# Patient Record
Sex: Female | Born: 1955 | Race: White | Hispanic: No | Marital: Married | Smoking: Never smoker
Health system: Southern US, Community
[De-identification: ages and names within clinical notes are randomized; demographics above are authoritative.]

## PROBLEM LIST (undated history)

## (undated) DIAGNOSIS — M5030 Other cervical disc degeneration, unspecified cervical region: Secondary | ICD-10-CM

## (undated) DIAGNOSIS — K219 Gastro-esophageal reflux disease without esophagitis: Secondary | ICD-10-CM

## (undated) DIAGNOSIS — B019 Varicella without complication: Secondary | ICD-10-CM

## (undated) DIAGNOSIS — I10 Essential (primary) hypertension: Secondary | ICD-10-CM

## (undated) DIAGNOSIS — G709 Myoneural disorder, unspecified: Secondary | ICD-10-CM

## (undated) DIAGNOSIS — D649 Anemia, unspecified: Secondary | ICD-10-CM

## (undated) DIAGNOSIS — E7800 Pure hypercholesterolemia, unspecified: Secondary | ICD-10-CM

## (undated) HISTORY — PX: BREAST SURGERY: SHX581

## (undated) HISTORY — PX: NECK SURGERY: SHX720

## (undated) HISTORY — PX: OTHER SURGICAL HISTORY: SHX169

## (undated) HISTORY — PX: ABDOMINAL HYSTERECTOMY: SHX81

## (undated) HISTORY — PX: CHOLECYSTECTOMY: SHX55

---

## 1993-01-15 HISTORY — PX: BREAST BIOPSY: SHX20

## 1993-01-15 HISTORY — PX: BREAST LUMPECTOMY: SHX2

## 1993-01-15 HISTORY — PX: BREAST EXCISIONAL BIOPSY: SUR124

## 2017-02-12 ENCOUNTER — Other Ambulatory Visit: Payer: Self-pay | Admitting: Internal Medicine

## 2017-02-12 DIAGNOSIS — Z1231 Encounter for screening mammogram for malignant neoplasm of breast: Secondary | ICD-10-CM

## 2017-03-08 ENCOUNTER — Ambulatory Visit
Admission: RE | Admit: 2017-03-08 | Discharge: 2017-03-08 | Disposition: A | Discharge status: Home / Self Care | Payer: 59 | Source: Ambulatory Visit | Attending: Internal Medicine | Admitting: Internal Medicine

## 2017-03-08 DIAGNOSIS — Z1231 Encounter for screening mammogram for malignant neoplasm of breast: Secondary | ICD-10-CM | POA: Diagnosis present

## 2017-04-09 ENCOUNTER — Inpatient Hospital Stay
Admission: RE | Admit: 2017-04-09 | Discharge: 2017-04-09 | Disposition: A | Discharge status: Home / Self Care | Payer: Self-pay | Source: Ambulatory Visit | Attending: *Deleted | Admitting: *Deleted

## 2017-04-09 ENCOUNTER — Other Ambulatory Visit: Payer: Self-pay | Admitting: *Deleted

## 2017-04-09 DIAGNOSIS — Z9289 Personal history of other medical treatment: Secondary | ICD-10-CM

## 2018-08-12 ENCOUNTER — Other Ambulatory Visit: Payer: Self-pay | Admitting: Internal Medicine

## 2018-09-24 ENCOUNTER — Other Ambulatory Visit: Payer: Self-pay | Admitting: Obstetrics and Gynecology

## 2018-09-24 DIAGNOSIS — N644 Mastodynia: Secondary | ICD-10-CM

## 2018-09-24 DIAGNOSIS — Z1231 Encounter for screening mammogram for malignant neoplasm of breast: Secondary | ICD-10-CM

## 2018-10-14 ENCOUNTER — Ambulatory Visit
Admission: RE | Admit: 2018-10-14 | Discharge: 2018-10-14 | Disposition: A | Discharge status: Home / Self Care | Payer: Medicare Other | Source: Ambulatory Visit | Attending: Obstetrics and Gynecology | Admitting: Obstetrics and Gynecology

## 2018-10-14 DIAGNOSIS — N644 Mastodynia: Secondary | ICD-10-CM

## 2018-10-14 DIAGNOSIS — Z1231 Encounter for screening mammogram for malignant neoplasm of breast: Secondary | ICD-10-CM | POA: Insufficient documentation

## 2019-01-21 ENCOUNTER — Other Ambulatory Visit: Payer: Medicare Other

## 2019-02-04 ENCOUNTER — Ambulatory Visit: Payer: Medicare Other | Attending: Internal Medicine

## 2019-02-04 DIAGNOSIS — Z23 Encounter for immunization: Secondary | ICD-10-CM

## 2019-02-04 NOTE — Progress Notes (Signed)
   Covid-19 Vaccination Clinic  Name:  Lynn Finley    MRN: [id number] DOB: Oct 14, 1955  02/04/2019  [name] was observed post Covid-19 immunization for 15 minutes without incidence. She was provided with Vaccine Information Sheet and instruction to access the V-Safe system.   [name] was instructed to call 911 with any severe reactions post vaccine: Marland Kitchen Difficulty breathing  . Swelling of your face and throat  . A fast heartbeat  . A bad rash all over your body  . Dizziness and weakness    Immunizations Administered    Name Date Dose VIS Date Route   Pfizer COVID-19 Vaccine 02/04/2019  9:31 AM 0.3 mL 12/26/2018 Intramuscular   Manufacturer: [place]   Lot: [id number]   [place]: [id number]

## 2019-02-23 ENCOUNTER — Ambulatory Visit: Payer: Medicare Other | Attending: Internal Medicine

## 2019-02-23 DIAGNOSIS — Z23 Encounter for immunization: Secondary | ICD-10-CM | POA: Insufficient documentation

## 2019-02-23 NOTE — Progress Notes (Signed)
   Covid-19 Vaccination Clinic  Name:  Lynn Finley    MRN: [id number] DOB: [birth year]  02/23/2019  Lynn Finley was observed post Covid-19 immunization for 15 minutes without incidence. She was provided with Vaccine Information Sheet and instruction to access the V-Safe system.   Lynn Finley was instructed to call 911 with any severe reactions post vaccine: Marland Kitchen Difficulty breathing  . Swelling of your face and throat  . A fast heartbeat  . A bad rash all over your body  . Dizziness and weakness    Immunizations Administered    Name Date Dose VIS Date Route   Pfizer COVID-19 Vaccine 02/23/2019 12:07 PM 0.3 mL 12/26/2018 Intramuscular   Manufacturer: [place]   Lot: [id number]   [place]: [id number]

## 2019-06-19 ENCOUNTER — Other Ambulatory Visit: Payer: Self-pay | Admitting: Internal Medicine

## 2019-06-19 DIAGNOSIS — Z1231 Encounter for screening mammogram for malignant neoplasm of breast: Secondary | ICD-10-CM

## 2019-10-16 ENCOUNTER — Other Ambulatory Visit: Payer: Self-pay | Admitting: Internal Medicine

## 2019-10-16 DIAGNOSIS — M25559 Pain in unspecified hip: Secondary | ICD-10-CM

## 2019-10-19 ENCOUNTER — Other Ambulatory Visit: Payer: Self-pay | Admitting: Internal Medicine

## 2019-10-19 DIAGNOSIS — M25559 Pain in unspecified hip: Secondary | ICD-10-CM

## 2019-10-28 ENCOUNTER — Other Ambulatory Visit: Payer: Self-pay | Admitting: Internal Medicine

## 2019-10-28 DIAGNOSIS — M25559 Pain in unspecified hip: Secondary | ICD-10-CM

## 2019-11-09 ENCOUNTER — Other Ambulatory Visit: Payer: Self-pay | Admitting: Internal Medicine

## 2019-11-09 DIAGNOSIS — M25559 Pain in unspecified hip: Secondary | ICD-10-CM

## 2019-11-13 ENCOUNTER — Other Ambulatory Visit: Payer: Self-pay

## 2019-11-13 ENCOUNTER — Encounter (INDEPENDENT_AMBULATORY_CARE_PROVIDER_SITE_OTHER): Payer: Self-pay

## 2019-11-13 ENCOUNTER — Ambulatory Visit
Admission: RE | Admit: 2019-11-13 | Discharge: 2019-11-13 | Disposition: A | Discharge status: Home / Self Care | Payer: Medicare Other | Source: Ambulatory Visit | Attending: Internal Medicine | Admitting: Internal Medicine

## 2019-11-13 DIAGNOSIS — M25559 Pain in unspecified hip: Secondary | ICD-10-CM | POA: Diagnosis present

## 2019-12-18 ENCOUNTER — Other Ambulatory Visit: Payer: Self-pay | Admitting: Internal Medicine

## 2019-12-18 DIAGNOSIS — M4807 Spinal stenosis, lumbosacral region: Secondary | ICD-10-CM

## 2019-12-29 ENCOUNTER — Ambulatory Visit
Admission: RE | Admit: 2019-12-29 | Discharge: 2019-12-29 | Disposition: A | Discharge status: Home / Self Care | Payer: Medicare Other | Source: Ambulatory Visit | Attending: Internal Medicine | Admitting: Internal Medicine

## 2019-12-29 ENCOUNTER — Other Ambulatory Visit: Payer: Self-pay

## 2019-12-29 DIAGNOSIS — M4807 Spinal stenosis, lumbosacral region: Secondary | ICD-10-CM | POA: Diagnosis not present

## 2020-04-21 ENCOUNTER — Other Ambulatory Visit
Admission: RE | Admit: 2020-04-21 | Discharge: 2020-04-21 | Disposition: A | Discharge status: Home / Self Care | Payer: Medicare Other | Source: Ambulatory Visit | Attending: Gastroenterology | Admitting: Gastroenterology

## 2020-04-21 ENCOUNTER — Other Ambulatory Visit: Payer: Self-pay

## 2020-04-21 DIAGNOSIS — Z01812 Encounter for preprocedural laboratory examination: Secondary | ICD-10-CM | POA: Insufficient documentation

## 2020-04-21 DIAGNOSIS — Z20822 Contact with and (suspected) exposure to covid-19: Secondary | ICD-10-CM | POA: Insufficient documentation

## 2020-04-22 ENCOUNTER — Encounter: Payer: Self-pay | Admitting: *Deleted

## 2020-04-22 LAB — SARS CORONAVIRUS 2 (TAT 6-24 HRS): SARS Coronavirus 2: NEGATIVE

## 2020-04-25 ENCOUNTER — Encounter: Payer: Self-pay | Admitting: *Deleted

## 2020-04-25 ENCOUNTER — Ambulatory Visit: Payer: Medicare Other | Admitting: Anesthesiology

## 2020-04-25 ENCOUNTER — Other Ambulatory Visit: Payer: Self-pay

## 2020-04-25 ENCOUNTER — Encounter
Admission: RE | Disposition: A | Discharge status: Home / Self Care | Payer: Self-pay | Source: Home / Self Care | Attending: Gastroenterology

## 2020-04-25 ENCOUNTER — Ambulatory Visit
Admission: RE | Admit: 2020-04-25 | Discharge: 2020-04-25 | Disposition: A | Discharge status: Home / Self Care | Payer: Medicare Other | Attending: Gastroenterology | Admitting: Gastroenterology

## 2020-04-25 DIAGNOSIS — Z791 Long term (current) use of non-steroidal anti-inflammatories (NSAID): Secondary | ICD-10-CM | POA: Diagnosis not present

## 2020-04-25 DIAGNOSIS — Z86010 Personal history of colonic polyps: Secondary | ICD-10-CM | POA: Insufficient documentation

## 2020-04-25 DIAGNOSIS — K640 First degree hemorrhoids: Secondary | ICD-10-CM | POA: Insufficient documentation

## 2020-04-25 DIAGNOSIS — K5730 Diverticulosis of large intestine without perforation or abscess without bleeding: Secondary | ICD-10-CM | POA: Diagnosis not present

## 2020-04-25 DIAGNOSIS — Z79899 Other long term (current) drug therapy: Secondary | ICD-10-CM | POA: Insufficient documentation

## 2020-04-25 DIAGNOSIS — Z1211 Encounter for screening for malignant neoplasm of colon: Secondary | ICD-10-CM | POA: Insufficient documentation

## 2020-04-25 HISTORY — DX: Varicella without complication: B01.9

## 2020-04-25 HISTORY — PX: COLONOSCOPY: SHX5424

## 2020-04-25 HISTORY — DX: Essential (primary) hypertension: I10

## 2020-04-25 HISTORY — DX: Gastro-esophageal reflux disease without esophagitis: K21.9

## 2020-04-25 HISTORY — DX: Pure hypercholesterolemia, unspecified: E78.00

## 2020-04-25 HISTORY — DX: Other cervical disc degeneration, unspecified cervical region: M50.30

## 2020-04-25 SURGERY — COLONOSCOPY
Anesthesia: General

## 2020-04-25 MED ORDER — PROPOFOL 500 MG/50ML IV EMUL
INTRAVENOUS | Status: AC
  Filled 2020-04-25: qty 50

## 2020-04-25 MED ORDER — SODIUM CHLORIDE 0.9 % IV SOLN: INTRAVENOUS | Status: DC

## 2020-04-25 MED ORDER — PROPOFOL 10 MG/ML IV BOLUS
INTRAVENOUS | Status: DC | PRN
  Administered 2020-04-25: 70 mg via INTRAVENOUS
  Administered 2020-04-25: 30 mg via INTRAVENOUS

## 2020-04-25 MED ORDER — PROPOFOL 500 MG/50ML IV EMUL
INTRAVENOUS | Status: DC | PRN
  Administered 2020-04-25: 125 ug/kg/min via INTRAVENOUS

## 2020-04-25 NOTE — Op Note (Signed)
[facility] Gastroenterology Patient Name: Lynn Finley Procedure Date: 04/25/2020 7:45 AM MRN: [id number] Account #: [id number] Date of Birth: 1955-07-07 Admit Type: Outpatient Age: 65 Room: [facility] ENDO ROOM 3 Gender: Female Note Status: Finalized Procedure:             Colonoscopy Indications:           Surveillance: Personal history of adenomatous polyps                         on last colonoscopy > 5 years ago Providers:             Andrey Farmer MD, MD Referring MD:          Leonie Douglas. Doy Hutching, MD (Referring MD) Medicines:             Monitored Anesthesia Care Complications:         No immediate complications. Procedure:             Pre-Anesthesia Assessment:                        - Prior to the procedure, a History and Physical was                         performed, and patient medications and allergies were                         reviewed. The patient is competent. The risks and                         benefits of the procedure and the sedation options and                         risks were discussed with the patient. All questions                         were answered and informed consent was obtained.                         Patient identification and proposed procedure were                         verified by the physician, the nurse, the anesthetist                         and the technician in the endoscopy suite. Mental                         Status Examination: alert and oriented. Airway                         Examination: normal oropharyngeal airway and neck                         mobility. Respiratory Examination: clear to                         auscultation. CV Examination: normal. Prophylactic  Antibiotics: The patient does not require prophylactic                         antibiotics. Prior Anticoagulants: The patient has                         taken no previous anticoagulant or antiplatelet                          agents. ASA Grade Assessment: II - A patient with mild                         systemic disease. After reviewing the risks and                         benefits, the patient was deemed in satisfactory                         condition to undergo the procedure. The anesthesia                         plan was to use monitored anesthesia care (MAC).                         Immediately prior to administration of medications,                         the patient was re-assessed for adequacy to receive                         sedatives. The heart rate, respiratory rate, oxygen                         saturations, blood pressure, adequacy of pulmonary                         ventilation, and response to care were monitored                         throughout the procedure. The physical status of the                         patient was re-assessed after the procedure.                        After obtaining informed consent, the colonoscope was                         passed under direct vision. Throughout the procedure,                         the patient's blood pressure, pulse, and oxygen                         saturations were monitored continuously. The                         Colonoscope was introduced through the anus and  advanced to the the cecum, identified by appendiceal                         orifice and ileocecal valve. The colonoscopy was                         performed without difficulty. The patient tolerated                         the procedure well. The quality of the bowel                         preparation was good. Findings:      The perianal and digital rectal examinations were normal.      Scattered small-mouthed diverticula were found in the sigmoid colon and       descending colon.      Internal hemorrhoids were found during retroflexion. The hemorrhoids       were Grade I (internal hemorrhoids that do not prolapse).      The exam was otherwise  without abnormality on direct and retroflexion       views. Impression:            - Diverticulosis in the sigmoid colon and in the                         descending colon.                        - Internal hemorrhoids.                        - The examination was otherwise normal on direct and                         retroflexion views.                        - No specimens collected. Recommendation:        - Discharge patient to home.                        - Resume previous diet.                        - Continue present medications.                        - Repeat colonoscopy in 10 years for surveillance.                        - Return to referring physician as previously                         scheduled. Procedure Code(s):     --- Professional ---                        L4562, Colorectal cancer screening; colonoscopy on                         individual at high risk Diagnosis Code(s):     ---  Professional ---                        Z86.010, Personal history of colonic polyps                        K64.0, First degree hemorrhoids                        K57.30, Diverticulosis of large intestine without                         perforation or abscess without bleeding CPT copyright 2019 American Medical Association. All rights reserved. The codes documented in this report are preliminary and upon coder review may  be revised to meet current compliance requirements. Andrey Farmer MD, MD 04/25/2020 8:07:19 AM Number of Addenda: 0 Note Initiated On: 04/25/2020 7:45 AM Scope Withdrawal Time: 0 hours 8 minutes 58 seconds  Total Procedure Duration: 0 hours 13 minutes 2 seconds  Estimated Blood Loss:  Estimated blood loss: none.      [facility]

## 2020-04-25 NOTE — Interval H&P Note (Signed)
History and Physical Interval Note:  04/25/2020 7:44 AM  Lynn Finley  has presented today for surgery, with the diagnosis of PERSONAL HX.OF COLON POLYPS.  The various methods of treatment have been discussed with the patient and family. After consideration of risks, benefits and other options for treatment, the patient has consented to  Procedure(s): COLONOSCOPY (N/A) as a surgical intervention.  The patient's history has been reviewed, patient examined, no change in status, stable for surgery.  I have reviewed the patient's chart and labs.  Questions were answered to the patient's satisfaction.     Lesly Rubenstein  Ok to proceed with colonoscopy

## 2020-04-25 NOTE — Anesthesia Preprocedure Evaluation (Signed)
Anesthesia Evaluation  Patient identified by MRN, date of birth, ID band Patient awake    Reviewed: Allergy & Precautions, NPO status , Patient's Chart, lab work & pertinent test results  History of Anesthesia Complications Negative for: history of anesthetic complications  Airway Mallampati: II  TM Distance: >3 FB Neck ROM: Full    Dental no notable dental hx. (+) Teeth Intact   Pulmonary neg pulmonary ROS, neg sleep apnea, neg COPD, Patient abstained from smoking.Not current smoker,    Pulmonary exam normal breath sounds clear to auscultation       Cardiovascular Exercise Tolerance: Good METShypertension, Pt. on medications (-) CAD and (-) Past MI (-) dysrhythmias  Rhythm:Regular Rate:Normal - Systolic murmurs    Neuro/Psych negative neurological ROS  negative psych ROS   GI/Hepatic GERD  ,(+)     (-) substance abuse  ,   Endo/Other  neg diabetes  Renal/GU negative Renal ROS     Musculoskeletal   Abdominal   Peds  Hematology   Anesthesia Other Findings Past Medical History: No date: Chicken pox No date: DDD (degenerative disc disease), cervical No date: GERD (gastroesophageal reflux disease) No date: Hypercholesterolemia No date: Hypertension  Reproductive/Obstetrics                             Anesthesia Physical Anesthesia Plan  ASA: II  Anesthesia Plan: General   Post-op Pain Management:    Induction: Intravenous  PONV Risk Score and Plan: 3 and Ondansetron, Propofol infusion and TIVA  Airway Management Planned: Nasal Cannula  Additional Equipment: None  Intra-op Plan:   Post-operative Plan:   Informed Consent: I have reviewed the patients History and Physical, chart, labs and discussed the procedure including the risks, benefits and alternatives for the proposed anesthesia with the patient or authorized representative who has indicated his/her understanding and  acceptance.     Dental advisory given  Plan Discussed with: CRNA and Surgeon  Anesthesia Plan Comments: (Discussed risks of anesthesia with patient, including possibility of difficulty with spontaneous ventilation under anesthesia necessitating airway intervention, PONV, and rare risks such as cardiac or respiratory or neurological events. Patient understands.)        Anesthesia Quick Evaluation

## 2020-04-25 NOTE — H&P (Signed)
Outpatient short stay form Pre-procedure 04/25/2020 7:42 AM Lynn Miyamoto MD, MPH  Primary Physician: Dr. Doy Hutching  Reason for visit:  Surveillance colonoscopy  History of present illness:   65 y/o lady with history of tubular adenoma on colonoscopy in 2013 here for surveillance colonoscopy. No family history of GI malignancies. No blood thinners. History of cholecystectomy and hysterectomy.    Current Facility-Administered Medications:  .  0.9 %  sodium chloride infusion, , Intravenous, Continuous, Kylii Ennis, Hilton Cork, MD, Last Rate: 20 mL/hr at 04/25/20 1610, Continued from Pre-op at 04/25/20 0727  Medications Prior to Admission  Medication Sig Dispense Refill Last Dose  . bisoprolol-hydrochlorothiazide (ZIAC) 5-6.25 MG tablet Take 1 tablet by mouth daily.   04/24/2020 at 2100  . celecoxib (CELEBREX) 100 MG capsule Take 100 mg by mouth 2 (two) times daily.   04/24/2020 at 0800  . Cholecalciferol 10 MCG (400 UNIT) CAPS Take 1,000 Units by mouth daily.   Past Week at Unknown time  . cyanocobalamin 1000 MCG tablet Take 1,000 mcg by mouth daily.   Past Week at Unknown time  . DULoxetine (CYMBALTA) 30 MG capsule Take 30 mg by mouth daily.   Past Week at Unknown time  . gabapentin (NEURONTIN) 300 MG capsule Take 300 mg by mouth daily.   Past Week at Unknown time  . pravastatin (PRAVACHOL) 10 MG tablet Take 10 mg by mouth daily.   Past Week at Unknown time  . pregabalin (LYRICA) 75 MG capsule Take 75 mg by mouth 2 (two) times daily.   Past Week at Unknown time  . psyllium (METAMUCIL) 58.6 % packet Take 1 packet by mouth daily.   Past Week at Unknown time     No Known Allergies   Past Medical History:  Diagnosis Date  . Chicken pox   . DDD (degenerative disc disease), cervical   . GERD (gastroesophageal reflux disease)   . Hypercholesterolemia   . Hypertension     Review of systems:  Otherwise negative.    Physical Exam  Gen: Alert, oriented. Appears stated age.  HEENT:  PERRLA. Lungs: No respiratory distress CV: RRR Abd: soft, benign, no masses Ext: No edema    Planned procedures: Proceed with colonoscopy. The patient understands the nature of the planned procedure, indications, risks, alternatives and potential complications including but not limited to bleeding, infection, perforation, damage to internal organs and possible oversedation/side effects from anesthesia. The patient agrees and gives consent to proceed.  Please refer to procedure notes for findings, recommendations and patient disposition/instructions.     Lynn Miyamoto MD, MPH Gastroenterology 04/25/2020  7:42 AM

## 2020-04-25 NOTE — Anesthesia Postprocedure Evaluation (Signed)
Anesthesia Post Note  Patient: Lynn Finley  Procedure(s) Performed: COLONOSCOPY (N/A )  Patient location during evaluation: Endoscopy Anesthesia Type: General Level of consciousness: awake and alert Pain management: pain level controlled Vital Signs Assessment: post-procedure vital signs reviewed and stable Respiratory status: spontaneous breathing, nonlabored ventilation, respiratory function stable and patient connected to nasal cannula oxygen Cardiovascular status: blood pressure returned to baseline and stable Postop Assessment: no apparent nausea or vomiting Anesthetic complications: no   No complications documented.   Last Vitals:  Vitals:   04/25/20 0817 04/25/20 0827  BP: 99/75 99/84  Pulse: 70 61  Resp: 16 15  Temp:    SpO2: 98% 98%    Last Pain:  Vitals:   04/25/20 0827  TempSrc:   PainSc: 0-No pain                 Arita Miss

## 2020-04-25 NOTE — Transfer of Care (Signed)
Immediate Anesthesia Transfer of Care Note  Patient: Lynn Finley  Procedure(s) Performed: COLONOSCOPY (N/A )  Patient Location: PACU  Anesthesia Type:MAC  Level of Consciousness: awake, alert  and oriented  Airway & Oxygen Therapy: Patient Spontanous Breathing  Post-op Assessment: Report given to RN and Post -op Vital signs reviewed and stable  Post vital signs: stable  Last Vitals:  Vitals Value Taken Time  BP    Temp    Pulse 63 04/25/20 0808  Resp 21 04/25/20 0808  SpO2 98 % 04/25/20 0808  Vitals shown include unvalidated device data.  Last Pain:  Vitals:   04/25/20 0701  TempSrc: Temporal  PainSc: 0-No pain         Complications: No complications documented.

## 2020-04-26 ENCOUNTER — Encounter: Payer: Self-pay | Admitting: Gastroenterology

## 2020-08-24 ENCOUNTER — Other Ambulatory Visit: Payer: Self-pay | Admitting: Internal Medicine

## 2020-08-24 DIAGNOSIS — G8929 Other chronic pain: Secondary | ICD-10-CM

## 2020-09-02 ENCOUNTER — Ambulatory Visit
Admission: RE | Admit: 2020-09-02 | Discharge: 2020-09-02 | Disposition: A | Discharge status: Home / Self Care | Payer: Medicare Other | Source: Ambulatory Visit | Attending: Internal Medicine | Admitting: Internal Medicine

## 2020-09-02 ENCOUNTER — Other Ambulatory Visit: Payer: Self-pay

## 2020-09-02 DIAGNOSIS — M25562 Pain in left knee: Secondary | ICD-10-CM | POA: Insufficient documentation

## 2020-09-02 DIAGNOSIS — G8929 Other chronic pain: Secondary | ICD-10-CM | POA: Insufficient documentation

## 2020-12-14 ENCOUNTER — Other Ambulatory Visit: Payer: Self-pay | Admitting: Internal Medicine

## 2020-12-14 DIAGNOSIS — Z1231 Encounter for screening mammogram for malignant neoplasm of breast: Secondary | ICD-10-CM

## 2021-01-02 ENCOUNTER — Ambulatory Visit: Payer: Medicare Other | Admitting: Dermatology

## 2021-01-04 ENCOUNTER — Ambulatory Visit: Payer: Medicare Other | Admitting: Dermatology

## 2021-01-26 ENCOUNTER — Ambulatory Visit
Admission: RE | Admit: 2021-01-26 | Discharge: 2021-01-26 | Disposition: A | Discharge status: Home / Self Care | Payer: Medicare Other | Source: Ambulatory Visit | Attending: Internal Medicine | Admitting: Internal Medicine

## 2021-01-26 ENCOUNTER — Other Ambulatory Visit: Payer: Self-pay

## 2021-01-26 DIAGNOSIS — Z1231 Encounter for screening mammogram for malignant neoplasm of breast: Secondary | ICD-10-CM | POA: Insufficient documentation

## 2021-01-31 ENCOUNTER — Other Ambulatory Visit: Payer: Self-pay

## 2021-01-31 ENCOUNTER — Ambulatory Visit: Payer: Medicare Other | Admitting: Dermatology

## 2021-01-31 DIAGNOSIS — Z1283 Encounter for screening for malignant neoplasm of skin: Secondary | ICD-10-CM

## 2021-01-31 DIAGNOSIS — L578 Other skin changes due to chronic exposure to nonionizing radiation: Secondary | ICD-10-CM

## 2021-01-31 DIAGNOSIS — Z808 Family history of malignant neoplasm of other organs or systems: Secondary | ICD-10-CM

## 2021-01-31 DIAGNOSIS — L814 Other melanin hyperpigmentation: Secondary | ICD-10-CM

## 2021-01-31 DIAGNOSIS — D1800 Hemangioma unspecified site: Secondary | ICD-10-CM

## 2021-01-31 DIAGNOSIS — L821 Other seborrheic keratosis: Secondary | ICD-10-CM

## 2021-01-31 DIAGNOSIS — D229 Melanocytic nevi, unspecified: Secondary | ICD-10-CM

## 2021-01-31 DIAGNOSIS — L853 Xerosis cutis: Secondary | ICD-10-CM

## 2021-01-31 NOTE — Progress Notes (Signed)
° °  New Patient Visit  Subjective  Lynn Finley is a 66 y.o. female who presents for the following: TBSE (Pt here for total body exam today. No hx of skin cancer or dysplastic nevi. Family hx of skin cancer - brother. Pt has a spot on her pubic area that she would like checked today. ).  Patient here for full body skin exam and skin cancer screening.  Objective  Well appearing patient in no apparent distress; mood and affect are within normal limits.  A full examination was performed including scalp, head, eyes, ears, nose, lips, neck, chest, axillae, abdomen, back, buttocks, bilateral upper extremities, bilateral lower extremities, hands, feet, fingers, toes, fingernails, and toenails. All findings within normal limits unless otherwise noted below.  right superpubic Stuck-on, waxy, tan-brown papule or plaque --Discussed benign etiology and prognosis.    Assessment & Plan  Xerosis of skin right zygoma  Xerosis vs AK at right zygoma  Recommend a daily moisturizer and sunscreen. Reapply every 2 hrs, Pt to call if doesn't clear up or gets thicker  Actinic keratoses are precancerous spots that appear secondary to cumulative UV radiation exposure/sun exposure over time. They are chronic with expected duration over 1 year. A portion of actinic keratoses will progress to squamous cell carcinoma of the skin. It is not possible to reliably predict which spots will progress to skin cancer and so treatment is recommended to prevent development of skin cancer.  Recommend daily broad spectrum sunscreen SPF 30+ to sun-exposed areas, reapply every 2 hours as needed.  Recommend staying in the shade or wearing long sleeves, sun glasses (UVA+UVB protection) and wide brim hats (4-inch brim around the entire circumference of the hat). Call for new or changing lesions.   Seborrheic keratosis right superpubic  Benign-appearing.  Observation.  Call clinic for new or changing lesions.  Recommend  daily use of broad spectrum spf 30+ sunscreen to sun-exposed areas.    Lentigines - Scattered tan macules - Due to sun exposure - Benign-appearing, observe - Recommend daily broad spectrum sunscreen SPF 30+ to sun-exposed areas, reapply every 2 hours as needed. - Call for any changes  Seborrheic Keratoses - Stuck-on, waxy, tan-brown papules and/or plaques  - Benign-appearing - Discussed benign etiology and prognosis. - Observe - Call for any changes  Melanocytic Nevi - Tan-brown and/or pink-flesh-colored symmetric macules and papules - Benign appearing on exam today - Observation - Call clinic for new or changing moles - Recommend daily use of broad spectrum spf 30+ sunscreen to sun-exposed areas.   Hemangiomas - Red papules - Discussed benign nature - Observe - Call for any changes  Actinic Damage - Chronic condition, secondary to cumulative UV/sun exposure - diffuse scaly erythematous macules with underlying dyspigmentation - Recommend daily broad spectrum sunscreen SPF 30+ to sun-exposed areas, reapply every 2 hours as needed.  - Staying in the shade or wearing long sleeves, sun glasses (UVA+UVB protection) and wide brim hats (4-inch brim around the entire circumference of the hat) are also recommended for sun protection.  - Call for new or changing lesions.  Skin cancer screening performed today.  Return in about 1 year (around 01/31/2022) for TBSE.  I, Harriett Sine, CMA, am acting as scribe for Forest Gleason, MD.   Documentation: I have reviewed the above documentation for accuracy and completeness, and I agree with the above.  Forest Gleason, MD

## 2021-01-31 NOTE — Patient Instructions (Addendum)
Recommend daily broad spectrum sunscreen SPF 30+ to sun-exposed areas, reapply every 2 hours as needed. Call for new or changing lesions.  Staying in the shade or wearing long sleeves, sun glasses (UVA+UVB protection) and wide brim hats (4-inch brim around the entire circumference of the hat) are also recommended for sun protection.    Recommend taking Heliocare sun protection supplement daily in sunny weather for additional sun protection. For maximum protection on the sunniest days, you can take up to 2 capsules of regular Heliocare OR take 1 capsule of Heliocare Ultra. For prolonged exposure (such as a full day in the sun), you can repeat your dose of the supplement 4 hours after your first dose. Heliocare can be purchased at [facility] or at [email or web address].      If You Need Anything After Your Visit  If you have any questions or concerns for your doctor, please call our main line at [phone] and press option 4 to reach your doctor's medical assistant. If no one answers, please leave a voicemail as directed and we will return your call as soon as possible. Messages left after 4 pm will be answered the following business day.   You may also send [place] a message via [place]. We typically respond to MyChart messages within 1-2 business days.  For prescription refills, please ask your pharmacy to contact our office. Our fax number is [phone].  If you have an urgent issue when the clinic is closed that cannot wait until the next business day, you can page your doctor at the number below.    Please note that while we do our best to be available for urgent issues outside of office hours, we are not available 24/7.   If you have an urgent issue and are unable to reach [place], you may choose to seek medical care at your doctor's office, retail clinic, urgent care center, or emergency room.  If you have a medical emergency, please immediately call 911 or go to the emergency  department.  Pager Numbers  - Dr. Nehemiah Massed: [phone]  - Dr. Laurence Ferrari: [phone]  - Dr. Nicole Kindred: [phone]  In the event of inclement weather, please call our main line at [phone] for an update on the status of any delays or closures.  Dermatology Medication Tips: Please keep the boxes that topical medications come in in order to help keep track of the instructions about where and how to use these. Pharmacies typically print the medication instructions only on the boxes and not directly on the medication tubes.   If your medication is too expensive, please contact our office at [phone] option 4 or send [place] a message through [place].   We are unable to tell what your co-pay for medications will be in advance as this is different depending on your insurance coverage. However, we may be able to find a substitute medication at lower cost or fill out paperwork to get insurance to cover a needed medication.   If a prior authorization is required to get your medication covered by your insurance company, please allow [place] 1-2 business days to complete this process.  Drug prices often vary depending on where the prescription is filled and some pharmacies may offer cheaper prices.  The website www.goodrx.com contains coupons for medications through different pharmacies. The prices here do not account for what the cost may be with help from insurance (it may be cheaper with your insurance), but the website can give you the price if you did  not use any insurance.  - You can print the associated coupon and take it with your prescription to the pharmacy.  - You may also stop by our office during regular business hours and pick up a GoodRx coupon card.  - If you need your prescription sent electronically to a different pharmacy, notify our office through [facility] or by phone at [phone] option 4.     Si Usted Necesita Algo Despus de Su Visita  Tambin puede enviarnos un  mensaje a travs de Pharmacist, community. Por lo general respondemos a los mensajes de MyChart en el transcurso de 1 a 2 das hbiles.  Para renovar recetas, por favor pida a su farmacia que se ponga en contacto con nuestra oficina. Harland Dingwall de fax es [place] [phone].  Si tiene un asunto urgente cuando la clnica est cerrada y que no puede esperar hasta el siguiente da hbil, puede llamar/localizar a su doctor(a) al nmero que aparece a continuacin.   Por favor, tenga en cuenta que aunque hacemos todo lo posible para estar disponibles para asuntos urgentes fuera del horario de [place], no estamos disponibles las 24 horas del da, los 7 das de la [place].   Si tiene un problema urgente y no puede comunicarse con nosotros, puede optar por buscar atencin mdica  en el consultorio de su doctor(a), en una clnica privada, en un centro de atencin urgente o en una sala de emergencias.  Si tiene Engineering geologist, por favor llame inmediatamente al 911 o vaya a la sala de emergencias.  Nmeros de bper  - Dr. Nehemiah Massed: [phone]  - Dra. Moye: [phone]  - Dra. Nicole Kindred: [phone]  En caso de inclemencias del [place], por favor llame a Johnsie Kindred principal al [phone] para una actualizacin sobre el [place] de cualquier retraso o cierre.  Consejos para la medicacin en dermatologa: Por favor, guarde las cajas en las que vienen los medicamentos de uso tpico para ayudarle a seguir las instrucciones sobre dnde y cmo usarlos. Las farmacias generalmente imprimen las instrucciones del medicamento slo en las cajas y no directamente en los tubos del [place].   Si su medicamento es muy caro, por favor, pngase en contacto con Zigmund Daniel llamando al [phone] y presione la opcin 4 o envenos un mensaje a travs de Pharmacist, community.   No podemos decirle cul ser su copago por los medicamentos por adelantado ya que esto es diferente dependiendo de la cobertura de su seguro. Sin embargo,  es posible que podamos encontrar un medicamento sustituto a Electrical engineer un formulario para que el seguro cubra el medicamento que se considera necesario.   Si se requiere una autorizacin previa para que su compaa de seguros [place] su medicamento, por favor permtanos de 1 a 2 das hbiles para completar este proceso.  Los precios de los medicamentos varan con frecuencia dependiendo del Environmental consultant de dnde se surte la receta y alguna farmacias pueden ofrecer precios ms baratos.  El sitio web www.goodrx.com tiene cupones para medicamentos de Airline pilot. Los precios aqu no tienen en cuenta lo que podra costar con la ayuda del seguro (puede ser ms barato con su seguro), pero el sitio web puede darle el precio si no utiliz Research scientist (physical sciences).  - Puede imprimir el cupn correspondiente y llevarlo con su receta a la farmacia.  - Tambin puede pasar por nuestra oficina durante el horario de atencin regular y Charity fundraiser una tarjeta de cupones de GoodRx.  - Si necesita que su receta se enve  electrnicamente a [place] diferente, informe a nuestra oficina a travs de MyChart de [place] o por telfono llamando al [phone] y presione la opcin 4.

## 2021-02-05 ENCOUNTER — Encounter: Payer: Self-pay | Admitting: Dermatology

## 2021-02-27 ENCOUNTER — Ambulatory Visit: Payer: Medicare Other | Admitting: Urology

## 2021-03-30 ENCOUNTER — Other Ambulatory Visit (HOSPITAL_COMMUNITY): Payer: Self-pay | Admitting: Obstetrics and Gynecology

## 2021-03-30 ENCOUNTER — Other Ambulatory Visit: Payer: Self-pay | Admitting: Obstetrics and Gynecology

## 2021-04-04 ENCOUNTER — Other Ambulatory Visit: Payer: Self-pay

## 2021-04-04 ENCOUNTER — Ambulatory Visit
Admission: RE | Admit: 2021-04-04 | Discharge: 2021-04-04 | Disposition: A | Discharge status: Home / Self Care | Payer: Medicare Other | Source: Ambulatory Visit | Attending: Obstetrics and Gynecology | Admitting: Obstetrics and Gynecology

## 2021-04-04 DIAGNOSIS — R109 Unspecified abdominal pain: Secondary | ICD-10-CM | POA: Diagnosis present

## 2021-04-04 DIAGNOSIS — R102 Pelvic and perineal pain: Secondary | ICD-10-CM | POA: Diagnosis present

## 2021-07-11 ENCOUNTER — Ambulatory Visit: Payer: Medicare Other | Admitting: Dermatology

## 2021-07-11 DIAGNOSIS — L821 Other seborrheic keratosis: Secondary | ICD-10-CM

## 2021-07-11 DIAGNOSIS — D489 Neoplasm of uncertain behavior, unspecified: Secondary | ICD-10-CM | POA: Diagnosis not present

## 2021-07-11 DIAGNOSIS — L820 Inflamed seborrheic keratosis: Secondary | ICD-10-CM | POA: Diagnosis not present

## 2021-07-11 DIAGNOSIS — D692 Other nonthrombocytopenic purpura: Secondary | ICD-10-CM

## 2021-07-11 DIAGNOSIS — L578 Other skin changes due to chronic exposure to nonionizing radiation: Secondary | ICD-10-CM | POA: Diagnosis not present

## 2021-07-11 NOTE — Progress Notes (Signed)
   Follow-Up Visit   Subjective  Lynn Finley is a 66 y.o. female who presents for the following: Skin Problem (Patient with a spot at corner of left eye. Present for years but over the last 2 months has gotten larger. Previous it was clear and flat, then a few years ago it turned blue and went back to being clear. Patient also with a spot at right low abdomen. Treated previously with LN2. She does have some pain in that area but also has some hip issues and is unsure if the pain is from the spot or from the hip. ).  Patient would also like to discuss the condition of the skin at arms and legs. She is concerned about how easily she bruises and gets skin tears. Patient advises she has not been on oral prednisone but has had some injections at knees and hips.  The following portions of the chart were reviewed this encounter and updated as appropriate:   Tobacco  Allergies  Meds  Problems  Med Hx  Surg Hx  Fam Hx      Review of Systems:  No other skin or systemic complaints except as noted in HPI or Assessment and Plan.  Objective  Well appearing patient in no apparent distress; mood and affect are within normal limits.  A focused examination was performed including arms, face, abdomen. Relevant physical exam findings are noted in the Assessment and Plan.  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 Inflamed on exam.  Will plan LN2 on follow up.    Purpura - Chronic; persistent and recurrent.  Treatable, but not curable. - Violaceous macules and patches - Benign - Related to trauma, age, sun damage and/or use of blood thinners, chronic use of topical and/or oral steroids - Observe - Call for worsening or  other concerns - Recommend DerMend Fragile Skin Moisturizing Formula Cream  Seborrheic Keratoses - Stuck-on, waxy, tan-brown papules and/or plaques  - Benign-appearing - Discussed benign etiology and prognosis. - Observe - Call for any changes  Actinic Damage - chronic, secondary to cumulative UV radiation exposure/sun exposure over time - diffuse scaly erythematous macules with underlying dyspigmentation - Recommend daily broad spectrum sunscreen SPF 30+ to sun-exposed areas, reapply every 2 hours as needed.  - Recommend staying in the shade or wearing long sleeves, sun glasses (UVA+UVB protection) and wide brim hats (4-inch brim around the entire circumference of the hat). - Call for new or changing lesions.   Return for 2-4 weeks to tx ISK and bx.  Graciella Belton, RMA, am acting as scribe for Forest Gleason, MD .  Documentation: I have reviewed the above documentation for accuracy and completeness, and I agree with the above.  Forest Gleason, MD

## 2021-07-18 ENCOUNTER — Encounter: Payer: Self-pay | Admitting: Dermatology

## 2021-09-21 ENCOUNTER — Ambulatory Visit: Payer: Medicare Other | Admitting: Dermatology

## 2021-09-21 DIAGNOSIS — D485 Neoplasm of uncertain behavior of skin: Secondary | ICD-10-CM

## 2021-09-21 DIAGNOSIS — D492 Neoplasm of unspecified behavior of bone, soft tissue, and skin: Secondary | ICD-10-CM

## 2021-09-21 DIAGNOSIS — L820 Inflamed seborrheic keratosis: Secondary | ICD-10-CM

## 2021-09-21 NOTE — Progress Notes (Signed)
   Follow-Up Visit   Subjective  Lynn Finley is a 66 y.o. female who presents for the following: Follow-up (Patient here today to treat ISK at right suprapubic. Also here for biopsy at left lateral canthus for irritated spot but patient advises the spot has gone down and is not as bothersome. ).   The following portions of the chart were reviewed this encounter and updated as appropriate:   Tobacco  Allergies  Meds  Problems  Med Hx  Surg Hx  Fam Hx      Review of Systems:  No other skin or systemic complaints except as noted in HPI or Assessment and Plan.  Objective  Well appearing patient in no apparent distress; mood and affect are within normal limits.  A focused examination was performed including face, groin, abdomen. Relevant physical exam findings are noted in the Assessment and Plan.  left lateral canthus 0.3 cm tan and skin colored papule without features suspicious for malignancy on dermoscopy   right suprapubic Erythematous stuck-on, waxy papule or plaque    Assessment & Plan  Neoplasm of skin left lateral canthus  Favor cyst   Benign-appearing.  Call for changes.  Call clinic for new or changing lesions.    Inflamed seborrheic keratosis right suprapubic  Symptomatic, irritating, patient would like treated.  Patient with some pain in the same area.   Destruction of lesion - right suprapubic  Destruction method: cryotherapy   Informed consent: discussed and consent obtained   Lesion destroyed using liquid nitrogen: Yes   Cryotherapy cycles:  2 Outcome: patient tolerated procedure well with no complications   Post-procedure details: wound care instructions given   Additional details:  Prior to procedure, discussed risks of blister formation, small wound, skin dyspigmentation, or rare scar following cryotherapy. Recommend Vaseline ointment to treated areas while healing.    Return for as scheduled, TBSE.  Graciella Belton, RMA, am acting  as scribe for Forest Gleason, MD .  Documentation: I have reviewed the above documentation for accuracy and completeness, and I agree with the above.  Forest Gleason, MD

## 2021-09-21 NOTE — Patient Instructions (Signed)
Cryotherapy Aftercare  Wash gently with soap and water everyday.   Apply Vaseline and Band-Aid daily until healed.     Due to recent changes in healthcare laws, you may see results of your pathology and/or laboratory studies on MyChart before the doctors have had a chance to review them. We understand that in some cases there may be results that are confusing or concerning to you. Please understand that not all results are received at the same time and often the doctors may need to interpret multiple results in order to provide you with the best plan of care or course of treatment. Therefore, we ask that you please give us 2 business days to thoroughly review all your results before contacting the office for clarification. Should we see a critical lab result, you will be contacted sooner.   If You Need Anything After Your Visit  If you have any questions or concerns for your doctor, please call our main line at [phone] and press option 4 to reach your doctor's medical assistant. If no one answers, please leave a voicemail as directed and we will return your call as soon as possible. Messages left after 4 pm will be answered the following business day.   You may also send us a message via MyChart. We typically respond to MyChart messages within 1-2 business days.  For prescription refills, please ask your pharmacy to contact our office. Our fax number is [phone].  If you have an urgent issue when the clinic is closed that cannot wait until the next business day, you can page your doctor at the number below.    Please note that while we do our best to be available for urgent issues outside of office hours, we are not available 24/7.   If you have an urgent issue and are unable to reach us, you may choose to seek medical care at your doctor's office, retail clinic, urgent care center, or emergency room.  If you have a medical emergency, please immediately call 911 or go to the  emergency department.  Pager Numbers  - Dr. Kowalski: [phone]  - Dr. Moye: [phone]  - Dr. Stewart: [phone]  In the event of inclement weather, please call our main line at [phone] for an update on the status of any delays or closures.  Dermatology Medication Tips: Please keep the boxes that topical medications come in in order to help keep track of the instructions about where and how to use these. Pharmacies typically print the medication instructions only on the boxes and not directly on the medication tubes.   If your medication is too expensive, please contact our office at [phone] option 4 or send us a message through MyChart.   We are unable to tell what your co-pay for medications will be in advance as this is different depending on your insurance coverage. However, we may be able to find a substitute medication at lower cost or fill out paperwork to get insurance to cover a needed medication.   If a prior authorization is required to get your medication covered by your insurance company, please allow us 1-2 business days to complete this process.  Drug prices often vary depending on where the prescription is filled and some pharmacies may offer cheaper prices.  The website www.goodrx.com contains coupons for medications through different pharmacies. The prices here do not account for what the cost may be with help from insurance (it may be cheaper with your insurance), but the website can   give you the price if you did not use any insurance.  - You can print the associated coupon and take it with your prescription to the pharmacy.  - You may also stop by our office during regular business hours and pick up a GoodRx coupon card.  - If you need your prescription sent electronically to a different pharmacy, notify our office through [place] MyChart or by phone at [phone] option 4.     Si Usted Necesita Algo Despus de Su Visita  Tambin puede  enviarnos un mensaje a travs de MyChart. Por lo general respondemos a los mensajes de MyChart en el transcurso de 1 a 2 das hbiles.  Para renovar recetas, por favor pida a su farmacia que se ponga en contacto con nuestra oficina. Nuestro nmero de fax es el [phone].  Si tiene un asunto urgente cuando la clnica est cerrada y que no puede esperar hasta el siguiente da hbil, puede llamar/localizar a su doctor(a) al nmero que aparece a continuacin.   Por favor, tenga en cuenta que aunque hacemos todo lo posible para estar disponibles para asuntos urgentes fuera del horario de oficina, no estamos disponibles las 24 horas del da, los 7 das de la semana.   Si tiene un problema urgente y no puede comunicarse con nosotros, puede optar por buscar atencin mdica  en el consultorio de su doctor(a), en una clnica privada, en un centro de atencin urgente o en una sala de emergencias.  Si tiene una emergencia mdica, por favor llame inmediatamente al 911 o vaya a la sala de emergencias.  Nmeros de bper  - Dr. Kowalski: [phone]  - Dra. Moye: [phone]  - Dra. Stewart: [phone]  En caso de inclemencias del tiempo, por favor llame a nuestra lnea principal al [phone] para una actualizacin sobre el [place] de cualquier retraso o cierre.  Consejos para la medicacin en dermatologa: Por favor, guarde las cajas en las que vienen los medicamentos de uso tpico para ayudarle a seguir las instrucciones sobre dnde y cmo usarlos. Las farmacias generalmente imprimen las instrucciones del medicamento slo en las cajas y no directamente en los tubos del medicamento.   Si su medicamento es muy caro, por favor, pngase en contacto con nuestra oficina llamando al [phone] y presione la opcin 4 o envenos un mensaje a travs de MyChart.   No podemos decirle cul ser su copago por los medicamentos por adelantado ya que esto es diferente dependiendo de la cobertura de su seguro.  Sin embargo, es posible que podamos encontrar un medicamento sustituto a menor costo o llenar un formulario para que el seguro cubra el medicamento que se considera necesario.   Si se requiere una autorizacin previa para que su compaa de seguros cubra su medicamento, por favor permtanos de 1 a 2 das hbiles para completar este proceso.  Los precios de los medicamentos varan con frecuencia dependiendo del lugar de dnde se surte la receta y alguna farmacias pueden ofrecer precios ms baratos.  El sitio web www.goodrx.com tiene cupones para medicamentos de diferentes farmacias. Los precios aqu no tienen en cuenta lo que podra costar con la ayuda del seguro (puede ser ms barato con su seguro), pero el sitio web puede darle el precio si no utiliz ningn seguro.  - Puede imprimir el cupn correspondiente y llevarlo con su receta a la farmacia.  - Tambin puede pasar por nuestra oficina durante el horario de atencin regular y recoger una tarjeta de cupones de GoodRx.  -   Si necesita que su receta se enve electrnicamente a una farmacia diferente, informe a nuestra oficina a travs de MyChart de [place] o por telfono llamando al [phone] y presione la opcin 4.  

## 2021-10-02 ENCOUNTER — Encounter: Payer: Self-pay | Admitting: Dermatology

## 2021-11-27 ENCOUNTER — Other Ambulatory Visit
Admission: RE | Admit: 2021-11-27 | Discharge: 2021-11-27 | Disposition: A | Discharge status: Home / Self Care | Payer: Medicare Other | Source: Ambulatory Visit | Attending: Sports Medicine | Admitting: Sports Medicine

## 2021-11-27 DIAGNOSIS — M25452 Effusion, left hip: Secondary | ICD-10-CM | POA: Insufficient documentation

## 2021-11-27 DIAGNOSIS — M25552 Pain in left hip: Secondary | ICD-10-CM | POA: Insufficient documentation

## 2021-11-27 LAB — SYNOVIAL CELL COUNT + DIFF, W/ CRYSTALS
Crystals, Fluid: NONE SEEN
Eosinophils-Synovial: 0 %
Lymphocytes-Synovial Fld: 26 %
Monocyte-Macrophage-Synovial Fluid: 70 %
Neutrophil, Synovial: 4 %
WBC, Synovial: 517 /mm3 — ABNORMAL HIGH (ref 0–200)

## 2021-11-29 ENCOUNTER — Other Ambulatory Visit: Payer: Self-pay | Admitting: Surgery

## 2021-11-29 DIAGNOSIS — M25551 Pain in right hip: Secondary | ICD-10-CM

## 2021-11-29 DIAGNOSIS — M25552 Pain in left hip: Secondary | ICD-10-CM

## 2021-12-06 ENCOUNTER — Other Ambulatory Visit: Payer: Self-pay | Admitting: Surgery

## 2021-12-06 DIAGNOSIS — M1712 Unilateral primary osteoarthritis, left knee: Secondary | ICD-10-CM

## 2021-12-08 ENCOUNTER — Ambulatory Visit: Admission: RE | Admit: 2021-12-08 | Payer: Medicare Other | Source: Ambulatory Visit

## 2021-12-08 ENCOUNTER — Ambulatory Visit
Admission: RE | Admit: 2021-12-08 | Discharge: 2021-12-08 | Disposition: A | Discharge status: Home / Self Care | Payer: Medicare Other | Source: Ambulatory Visit | Attending: Surgery | Admitting: Surgery

## 2021-12-08 DIAGNOSIS — M25552 Pain in left hip: Secondary | ICD-10-CM | POA: Diagnosis present

## 2021-12-08 DIAGNOSIS — M1712 Unilateral primary osteoarthritis, left knee: Secondary | ICD-10-CM

## 2021-12-08 DIAGNOSIS — M25551 Pain in right hip: Secondary | ICD-10-CM | POA: Diagnosis present

## 2021-12-15 ENCOUNTER — Other Ambulatory Visit: Payer: Self-pay | Admitting: Internal Medicine

## 2021-12-15 DIAGNOSIS — U071 COVID-19: Secondary | ICD-10-CM

## 2021-12-15 DIAGNOSIS — Z1231 Encounter for screening mammogram for malignant neoplasm of breast: Secondary | ICD-10-CM

## 2021-12-15 HISTORY — DX: COVID-19: U07.1

## 2022-01-15 DIAGNOSIS — B2790 Infectious mononucleosis, unspecified without complication: Secondary | ICD-10-CM

## 2022-01-15 HISTORY — DX: Infectious mononucleosis, unspecified without complication: B27.90

## 2022-02-01 ENCOUNTER — Encounter: Payer: Medicare Other | Admitting: Dermatology

## 2022-02-09 ENCOUNTER — Other Ambulatory Visit: Payer: Self-pay | Admitting: Surgery

## 2022-02-14 ENCOUNTER — Ambulatory Visit
Admission: RE | Admit: 2022-02-14 | Discharge: 2022-02-14 | Disposition: A | Discharge status: Home / Self Care | Payer: Medicare Other | Source: Ambulatory Visit | Attending: Physician Assistant | Admitting: Physician Assistant

## 2022-02-14 ENCOUNTER — Other Ambulatory Visit: Payer: Self-pay | Admitting: Physician Assistant

## 2022-02-14 DIAGNOSIS — R509 Fever, unspecified: Secondary | ICD-10-CM

## 2022-02-14 DIAGNOSIS — R17 Unspecified jaundice: Secondary | ICD-10-CM

## 2022-02-14 DIAGNOSIS — R112 Nausea with vomiting, unspecified: Secondary | ICD-10-CM | POA: Diagnosis present

## 2022-02-14 DIAGNOSIS — R7989 Other specified abnormal findings of blood chemistry: Secondary | ICD-10-CM | POA: Diagnosis not present

## 2022-02-14 MED ORDER — IOHEXOL 300 MG/ML  SOLN
100.0000 mL | Freq: Once | INTRAMUSCULAR | Status: AC | PRN
  Administered 2022-02-14: 100 mL via INTRAVENOUS

## 2022-02-16 ENCOUNTER — Inpatient Hospital Stay: Admission: RE | Admit: 2022-02-16 | Payer: Medicare Other | Source: Ambulatory Visit

## 2022-02-20 ENCOUNTER — Inpatient Hospital Stay: Admission: RE | Admit: 2022-02-20 | Payer: Medicare Other | Source: Ambulatory Visit

## 2022-02-27 ENCOUNTER — Ambulatory Visit: Admit: 2022-02-27 | Payer: Medicare Other | Admitting: Surgery

## 2022-02-27 SURGERY — ARTHROPLASTY, HIP, TOTAL,POSTERIOR APPROACH
Anesthesia: Choice | Site: Hip | Laterality: Left

## 2022-03-15 ENCOUNTER — Encounter: Payer: Medicare Other | Admitting: Dermatology

## 2022-03-21 ENCOUNTER — Other Ambulatory Visit: Payer: Self-pay | Admitting: Surgery

## 2022-03-23 ENCOUNTER — Encounter
Admission: RE | Admit: 2022-03-23 | Discharge: 2022-03-23 | Disposition: A | Discharge status: Home / Self Care | Payer: Medicare Other | Source: Ambulatory Visit | Attending: Surgery | Admitting: Surgery

## 2022-03-23 ENCOUNTER — Other Ambulatory Visit: Payer: Self-pay

## 2022-03-23 VITALS — BP 126/62 | HR 62 | Resp 12 | Ht 66.0 in | Wt 177.0 lb

## 2022-03-23 DIAGNOSIS — Z01818 Encounter for other preprocedural examination: Secondary | ICD-10-CM | POA: Insufficient documentation

## 2022-03-23 DIAGNOSIS — R001 Bradycardia, unspecified: Secondary | ICD-10-CM | POA: Diagnosis not present

## 2022-03-23 LAB — TYPE AND SCREEN
ABO/RH(D): O NEG
Antibody Screen: NEGATIVE

## 2022-03-23 LAB — URINALYSIS, ROUTINE W REFLEX MICROSCOPIC
Bilirubin Urine: NEGATIVE
Glucose, UA: NEGATIVE mg/dL
Hgb urine dipstick: NEGATIVE
Ketones, ur: NEGATIVE mg/dL
Leukocytes,Ua: NEGATIVE
Nitrite: NEGATIVE
Protein, ur: NEGATIVE mg/dL
Specific Gravity, Urine: 1.025 (ref 1.005–1.030)
pH: 5 (ref 5.0–8.0)

## 2022-03-23 LAB — SURGICAL PCR SCREEN
MRSA, PCR: NEGATIVE
Staphylococcus aureus: NEGATIVE

## 2022-03-23 NOTE — Patient Instructions (Signed)
Your procedure is scheduled on: Tuesday March 19 To find out your arrival time, please call [phone] between [place] on:  Monday March 18  Report to the Registration Desk on the 1st floor of the [facility]. Valet parking is available.  If your arrival time is 6:00 am, do not arrive before that time as the [place] entrance doors do not open until 6:00 am.  REMEMBER: Instructions that are not followed completely may result in serious medical risk, up to and including death; or upon the discretion of your surgeon and anesthesiologist your surgery may need to be rescheduled.  Do not eat food after midnight the night before surgery.  No gum chewing or hard candies.  You may however, drink CLEAR liquids up to 2 hours before you are scheduled to arrive for your surgery. Do not drink anything within 2 hours of your scheduled arrival time.  Clear liquids include: - water  - apple juice without pulp - gatorade (not RED colors) - black coffee or tea (Do NOT add milk or creamers to the coffee or tea) Do NOT drink anything that is not on this list.  In addition, your doctor has ordered for you to drink the provided:  Ensure Pre-Surgery Clear Carbohydrate Drink   Drinking this carbohydrate drink up to two hours before surgery helps to reduce insulin resistance and improve patient outcomes. Please complete drinking 2 hours before scheduled arrival time.  One week prior to surgery: Stop Anti-inflammatories (NSAIDS) such as Advil, Aleve, Ibuprofen, Motrin, Naproxen, Naprosyn and Aspirin based products such as Excedrin, Goody's Powder, BC Powder. You may however, continue to take Tylenol if needed for pain up until the day of surgery.  Stop ANY OVER THE COUNTER supplements until after surgery.  Continue taking all prescribed medications.   TAKE ONLY THESE MEDICATIONS THE MORNING OF SURGERY WITH A SIP OF WATER:  none  No Alcohol for 24 hours before or after surgery.  No Smoking  including e-cigarettes for 24 hours before surgery.  No chewable tobacco products for at least 6 hours before surgery.  No nicotine patches on the day of surgery.  Do not use any "recreational" drugs for at least a week (preferably 2 weeks) before your surgery.  Please be advised that the combination of cocaine and anesthesia may have negative outcomes, up to and including death. If you test positive for cocaine, your surgery will be cancelled.  On the morning of surgery brush your teeth with toothpaste and water, you may rinse your mouth with mouthwash if you wish. Do not swallow any toothpaste or mouthwash.  Use CHG Soap or wipes as directed on instruction sheet.  Do not wear lotions, powders, or perfumes.   Do not shave body hair from the neck down 48 hours before surgery.  Wear comfortable clothing (specific to your surgery type) to the hospital.  Do not wear jewelry, make-up, hairpins, clips or nail polish.  Contact lenses, hearing aids and dentures may not be worn into surgery.  Do not bring valuables to the hospital. [facility] is not responsible for any missing/lost belongings or valuables.   Notify your doctor if there is any change in your medical condition (cold, fever, infection).  If you are being discharged the day of surgery, you will not be allowed to drive home. You will need a responsible individual to drive you home and stay with you for 24 hours after surgery.   If you are taking public transportation, you will  need to have a responsible individual with you.  If you are being admitted to the hospital overnight, leave your suitcase in the car. After surgery it may be brought to your room.  In case of increased patient census, it may be necessary for you, the patient, to continue your postoperative care in the Same Day Surgery department.  After surgery, you can help prevent lung complications by doing breathing exercises.  Take deep breaths and cough every 1-2  hours. Your doctor may order a device called an Incentive Spirometer to help you take deep breaths. When coughing or sneezing, hold a pillow firmly against your incision with both hands. This is called "splinting." Doing this helps protect your incision. It also decreases belly discomfort.  Surgery Visitation Policy:  Patients undergoing a surgery or procedure may have two family members or support persons with them as long as the person is not COVID-19 positive or experiencing its symptoms.   Inpatient Visitation:    Visiting hours are 7 a.m. to 8 p.m. Up to four visitors are allowed at one time in a patient room. The visitors may rotate out with other people during the day. One designated support person (adult) may remain overnight.  Due to an increase in RSV and influenza rates and associated hospitalizations, children ages 45 and under will not be able to visit patients in [facility]. Masks continue to be strongly recommended.  Please call the [place] [facility]. at [phone] if you have any questions about these instructions.     Preparing for Surgery with CHLORHEXIDINE GLUCONATE (CHG) Soap  Chlorhexidine Gluconate (CHG) Soap  o An antiseptic cleaner that kills germs and bonds with the skin to continue killing germs even after washing  o Used for showering the night before surgery and morning of surgery  Before surgery, you can play an important role by reducing the number of germs on your skin.  CHG (Chlorhexidine gluconate) soap is an antiseptic cleanser which kills germs and bonds with the skin to continue killing germs even after washing.  Please do not use if you have an allergy to CHG or antibacterial soaps. If your skin becomes reddened/irritated stop using the CHG.  1. Shower the NIGHT BEFORE SURGERY and the MORNING OF SURGERY with CHG soap.  2. If you choose to wash your hair, wash your hair first as usual with your normal shampoo.  3. After  shampooing, rinse your hair and body thoroughly to remove the shampoo.  4. Use CHG as you would any other liquid soap. You can apply CHG directly to the skin and wash gently with a scrungie or a clean washcloth.  5. Apply the CHG soap to your body only from the neck down. Do not use on open wounds or open sores. Avoid contact with your eyes, ears, mouth, and genitals (private parts). Wash face and genitals (private parts) with your normal soap.  6. Wash thoroughly, paying special attention to the area where your surgery will be performed.  7. Thoroughly rinse your body with warm water.  8. Do not shower/wash with your normal soap after using and rinsing off the CHG soap.  9. Pat yourself dry with a clean towel.  10. Wear clean pajamas to bed the night before surgery.  12. Place clean sheets on your bed the night of your first shower and do not sleep with pets.  13. Shower again with the CHG soap on the day of surgery prior to arriving at the hospital.  14. Do not apply any deodorants/lotions/powders.  15. Please wear clean clothes to the hospital.  How to Use an Incentive Spirometer  An incentive spirometer is a tool that measures how well you are filling your lungs with each breath. Learning to take long, deep breaths using this tool can help you keep your lungs clear and active. This may help to reverse or lessen your chance of developing breathing (pulmonary) problems, especially infection. You may be asked to use a spirometer: After a surgery. If you have a lung problem or a history of smoking. After a long period of time when you have been unable to move or be active. If the spirometer includes an indicator to show the highest number that you have reached, your health care provider or respiratory therapist will help you set a goal. Keep a log of your progress as told by your health care provider. What are the risks? Breathing too quickly may cause dizziness or cause you to pass  out. Take your time so you do not get dizzy or light-headed. If you are in pain, you may need to take pain medicine before doing incentive spirometry. It is harder to take a deep breath if you are having pain. How to use your incentive spirometer  Sit up on the edge of your bed or on a chair. Hold the incentive spirometer so that it is in an upright position. Before you use the spirometer, breathe out normally. Place the mouthpiece in your mouth. Make sure your lips are closed tightly around it. Breathe in slowly and as deeply as you can through your mouth, causing the piston or the ball to rise toward the top of the chamber. Hold your breath for 3-5 seconds, or for as long as possible. If the spirometer includes a coach indicator, use this to guide you in breathing. Slow down your breathing if the indicator goes above the marked areas. Remove the mouthpiece from your mouth and breathe out normally. The piston or ball will return to the bottom of the chamber. Rest for a few seconds, then repeat the steps 10 or more times. Take your time and take a few normal breaths between deep breaths so that you do not get dizzy or light-headed. Do this every 1-2 hours when you are awake. If the spirometer includes a goal marker to show the highest number you have reached (best effort), use this as a goal to work toward during each repetition. After each set of 10 deep breaths, cough a few times. This will help to make sure that your lungs are clear. If you have an incision on your chest or abdomen from surgery, place a pillow or a rolled-up towel firmly against the incision when you cough. This can help to reduce pain while taking deep breaths and coughing. General tips When you are able to get out of bed: Walk around often. Continue to take deep breaths and cough in order to clear your lungs. Keep using the incentive spirometer until your health care provider says it is okay to stop using it. If you have  been in the hospital, you may be told to keep using the spirometer at home. Contact a health care provider if: You are having difficulty using the spirometer. You have trouble using the spirometer as often as instructed. Your pain medicine is not giving enough relief for you to use the spirometer as told. You have a fever. Get help right away if: You develop shortness of breath. You  develop a cough with bloody mucus from the lungs. You have fluid or blood coming from an incision site after you cough. Summary An incentive spirometer is a tool that can help you learn to take long, deep breaths to keep your lungs clear and active. You may be asked to use a spirometer after a surgery, if you have a lung problem or a history of smoking, or if you have been inactive for a long period of time. Use your incentive spirometer as instructed every 1-2 hours while you are awake. If you have an incision on your chest or abdomen, place a pillow or a rolled-up towel firmly against your incision when you cough. This will help to reduce pain. Get help right away if you have shortness of breath, you cough up bloody mucus, or blood comes from your incision when you cough. This information is not intended to replace advice given to you by your health care provider. Make sure you discuss any questions you have with your health care provider. Document Revised: 03/23/2019 Document Reviewed: 03/23/2019 Elsevier Patient Education  [employer].    Preoperative Educational Videos for Total Hip, Knee and Shoulder Replacements  To better prepare for surgery, please view our videos that explain the physical activity and discharge planning required to have the best surgical recovery at [facility].  [email or web address]  Questions? Call [phone] or email [email or web address]

## 2022-04-03 ENCOUNTER — Ambulatory Visit: Payer: Medicare Other

## 2022-04-03 ENCOUNTER — Other Ambulatory Visit: Payer: Self-pay

## 2022-04-03 ENCOUNTER — Observation Stay
Admission: RE | Admit: 2022-04-03 | Discharge: 2022-04-04 | Disposition: A | Discharge status: Home / Self Care | Payer: Medicare Other | Attending: Surgery | Admitting: Surgery

## 2022-04-03 ENCOUNTER — Ambulatory Visit: Payer: Medicare Other | Admitting: Urgent Care

## 2022-04-03 ENCOUNTER — Encounter
Admission: RE | Disposition: A | Discharge status: Home / Self Care | Payer: Self-pay | Source: Home / Self Care | Attending: Surgery

## 2022-04-03 ENCOUNTER — Encounter: Payer: Self-pay | Admitting: Surgery

## 2022-04-03 DIAGNOSIS — Z79899 Other long term (current) drug therapy: Secondary | ICD-10-CM | POA: Diagnosis not present

## 2022-04-03 DIAGNOSIS — Z8616 Personal history of COVID-19: Secondary | ICD-10-CM | POA: Diagnosis not present

## 2022-04-03 DIAGNOSIS — M1612 Unilateral primary osteoarthritis, left hip: Secondary | ICD-10-CM | POA: Diagnosis present

## 2022-04-03 DIAGNOSIS — Z96642 Presence of left artificial hip joint: Secondary | ICD-10-CM

## 2022-04-03 DIAGNOSIS — I10 Essential (primary) hypertension: Secondary | ICD-10-CM | POA: Insufficient documentation

## 2022-04-03 HISTORY — PX: TOTAL HIP ARTHROPLASTY: SHX124

## 2022-04-03 LAB — ABO/RH: ABO/RH(D): O NEG

## 2022-04-03 SURGERY — ARTHROPLASTY, HIP, TOTAL,POSTERIOR APPROACH
Anesthesia: General | Site: Hip | Laterality: Left

## 2022-04-03 MED ORDER — FENTANYL CITRATE (PF) 100 MCG/2ML IJ SOLN
25.0000 ug | INTRAMUSCULAR | Status: DC | PRN
  Administered 2022-04-03 (×3): 25 ug via INTRAVENOUS

## 2022-04-03 MED ORDER — LACTATED RINGERS IV SOLN: INTRAVENOUS | Status: DC | PRN

## 2022-04-03 MED ORDER — LIDOCAINE HCL (PF) 2 % IJ SOLN
INTRAMUSCULAR | Status: AC
  Filled 2022-04-03: qty 5

## 2022-04-03 MED ORDER — SODIUM CHLORIDE 0.9 % IV SOLN
INTRAVENOUS | Status: DC | PRN
  Administered 2022-04-03: 60 mL

## 2022-04-03 MED ORDER — ONDANSETRON HCL 4 MG PO TABS: 4.0000 mg | ORAL_TABLET | Freq: Four times a day (QID) | ORAL | Status: DC | PRN

## 2022-04-03 MED ORDER — FENTANYL CITRATE (PF) 100 MCG/2ML IJ SOLN
INTRAMUSCULAR | Status: AC
  Administered 2022-04-03: 25 ug via INTRAVENOUS
  Filled 2022-04-03: qty 2

## 2022-04-03 MED ORDER — HYDROMORPHONE HCL 1 MG/ML IJ SOLN
INTRAMUSCULAR | Status: AC
  Filled 2022-04-03: qty 1

## 2022-04-03 MED ORDER — BUPIVACAINE LIPOSOME 1.3 % IJ SUSP
INTRAMUSCULAR | Status: AC
  Filled 2022-04-03: qty 20

## 2022-04-03 MED ORDER — ACETAMINOPHEN 500 MG PO TABS
1000.0000 mg | ORAL_TABLET | Freq: Four times a day (QID) | ORAL | Status: AC
  Administered 2022-04-03 – 2022-04-04 (×4): 1000 mg via ORAL
  Filled 2022-04-03 (×4): qty 2

## 2022-04-03 MED ORDER — ACETAMINOPHEN 10 MG/ML IV SOLN
INTRAVENOUS | Status: DC | PRN
  Administered 2022-04-03: 1000 mg via INTRAVENOUS

## 2022-04-03 MED ORDER — APIXABAN 2.5 MG PO TABS
2.5000 mg | ORAL_TABLET | Freq: Two times a day (BID) | ORAL | Status: DC
  Administered 2022-04-04: 2.5 mg via ORAL
  Filled 2022-04-03: qty 1

## 2022-04-03 MED ORDER — BISACODYL 10 MG RE SUPP: 10.0000 mg | Freq: Every day | RECTAL | Status: DC | PRN

## 2022-04-03 MED ORDER — MIDAZOLAM HCL 2 MG/2ML IJ SOLN
INTRAMUSCULAR | Status: AC
  Filled 2022-04-03: qty 2

## 2022-04-03 MED ORDER — ONDANSETRON HCL 4 MG/2ML IJ SOLN: 4.0000 mg | Freq: Four times a day (QID) | INTRAMUSCULAR | Status: DC | PRN

## 2022-04-03 MED ORDER — KETOROLAC TROMETHAMINE 30 MG/ML IJ SOLN
INTRAMUSCULAR | Status: DC | PRN
  Administered 2022-04-03: 30 mg via INTRAMUSCULAR

## 2022-04-03 MED ORDER — OXYCODONE HCL 5 MG PO TABS
ORAL_TABLET | ORAL | Status: AC
  Filled 2022-04-03: qty 1

## 2022-04-03 MED ORDER — ONDANSETRON HCL 4 MG/2ML IJ SOLN
INTRAMUSCULAR | Status: AC
  Administered 2022-04-03: 4 mg via INTRAVENOUS
  Filled 2022-04-03: qty 2

## 2022-04-03 MED ORDER — ROCURONIUM BROMIDE 100 MG/10ML IV SOLN
INTRAVENOUS | Status: DC | PRN
  Administered 2022-04-03: 50 mg via INTRAVENOUS
  Administered 2022-04-03: 20 mg via INTRAVENOUS

## 2022-04-03 MED ORDER — SODIUM CHLORIDE 0.9 % IV SOLN: INTRAVENOUS | Status: DC

## 2022-04-03 MED ORDER — POLYETHYLENE GLYCOL 3350 17 G PO PACK: 17.0000 g | PACK | Freq: Every day | ORAL | Status: DC | PRN

## 2022-04-03 MED ORDER — FLEET ENEMA 7-19 GM/118ML RE ENEM: 1.0000 | ENEMA | Freq: Once | RECTAL | Status: DC | PRN

## 2022-04-03 MED ORDER — PROPOFOL 10 MG/ML IV BOLUS
INTRAVENOUS | Status: AC
  Filled 2022-04-03: qty 40

## 2022-04-03 MED ORDER — ORAL CARE MOUTH RINSE: 15.0000 mL | Freq: Once | OROMUCOSAL | Status: AC

## 2022-04-03 MED ORDER — DEXAMETHASONE SODIUM PHOSPHATE 10 MG/ML IJ SOLN
INTRAMUSCULAR | Status: AC
  Filled 2022-04-03: qty 1

## 2022-04-03 MED ORDER — OXYCODONE HCL 5 MG/5ML PO SOLN: 5.0000 mg | Freq: Once | ORAL | Status: AC | PRN

## 2022-04-03 MED ORDER — PROPOFOL 1000 MG/100ML IV EMUL
INTRAVENOUS | Status: AC
  Filled 2022-04-03: qty 100

## 2022-04-03 MED ORDER — FENTANYL CITRATE (PF) 100 MCG/2ML IJ SOLN
INTRAMUSCULAR | Status: AC
  Filled 2022-04-03: qty 2

## 2022-04-03 MED ORDER — BUPIVACAINE HCL (PF) 0.5 % IJ SOLN
INTRAMUSCULAR | Status: AC
  Filled 2022-04-03: qty 30

## 2022-04-03 MED ORDER — SUGAMMADEX SODIUM 200 MG/2ML IV SOLN
INTRAVENOUS | Status: DC | PRN
  Administered 2022-04-03: 200 mg via INTRAVENOUS

## 2022-04-03 MED ORDER — BUPIVACAINE-EPINEPHRINE (PF) 0.5% -1:200000 IJ SOLN
INTRAMUSCULAR | Status: DC | PRN
  Administered 2022-04-03: 30 mL

## 2022-04-03 MED ORDER — CEFAZOLIN SODIUM-DEXTROSE 2-4 GM/100ML-% IV SOLN
2.0000 g | Freq: Four times a day (QID) | INTRAVENOUS | Status: AC
  Administered 2022-04-03 – 2022-04-04 (×3): 2 g via INTRAVENOUS
  Filled 2022-04-03 (×2): qty 100

## 2022-04-03 MED ORDER — BISOPROLOL-HYDROCHLOROTHIAZIDE 5-6.25 MG PO TABS
1.0000 | ORAL_TABLET | Freq: Every day | ORAL | Status: DC
  Administered 2022-04-03: 1 via ORAL
  Filled 2022-04-03: qty 1

## 2022-04-03 MED ORDER — MIDAZOLAM HCL 2 MG/2ML IJ SOLN
INTRAMUSCULAR | Status: DC | PRN
  Administered 2022-04-03: 1 mg via INTRAVENOUS

## 2022-04-03 MED ORDER — CHLORHEXIDINE GLUCONATE 0.12 % MT SOLN
OROMUCOSAL | Status: AC
  Administered 2022-04-03: 15 mL via OROMUCOSAL
  Filled 2022-04-03: qty 15

## 2022-04-03 MED ORDER — EPINEPHRINE PF 1 MG/ML IJ SOLN
INTRAMUSCULAR | Status: AC
  Filled 2022-04-03: qty 1

## 2022-04-03 MED ORDER — METOCLOPRAMIDE HCL 5 MG/ML IJ SOLN: 5.0000 mg | Freq: Three times a day (TID) | INTRAMUSCULAR | Status: DC | PRN

## 2022-04-03 MED ORDER — LACTATED RINGERS IV SOLN: INTRAVENOUS | Status: DC

## 2022-04-03 MED ORDER — DEXMEDETOMIDINE HCL IN NACL 80 MCG/20ML IV SOLN
INTRAVENOUS | Status: DC | PRN
  Administered 2022-04-03: 12 ug via BUCCAL

## 2022-04-03 MED ORDER — SEVOFLURANE IN SOLN
RESPIRATORY_TRACT | Status: AC
  Filled 2022-04-03: qty 250

## 2022-04-03 MED ORDER — PROPOFOL 10 MG/ML IV BOLUS
INTRAVENOUS | Status: DC | PRN
  Administered 2022-04-03 (×2): 50 mg via INTRAVENOUS
  Administered 2022-04-03: 150 mg via INTRAVENOUS
  Administered 2022-04-03: 50 ug/kg/min via INTRAVENOUS
  Administered 2022-04-03: 50 mg via INTRAVENOUS

## 2022-04-03 MED ORDER — GLYCOPYRROLATE 0.2 MG/ML IJ SOLN
INTRAMUSCULAR | Status: AC
  Filled 2022-04-03: qty 1

## 2022-04-03 MED ORDER — ONDANSETRON HCL 4 MG/2ML IJ SOLN: 4.0000 mg | Freq: Once | INTRAMUSCULAR | Status: DC | PRN

## 2022-04-03 MED ORDER — TRIAMCINOLONE ACETONIDE 40 MG/ML IJ SUSP
INTRAMUSCULAR | Status: DC | PRN
  Administered 2022-04-03: 80 mg

## 2022-04-03 MED ORDER — METOCLOPRAMIDE HCL 5 MG PO TABS: 5.0000 mg | ORAL_TABLET | Freq: Three times a day (TID) | ORAL | Status: DC | PRN

## 2022-04-03 MED ORDER — CHLORHEXIDINE GLUCONATE 0.12 % MT SOLN: 15.0000 mL | Freq: Once | OROMUCOSAL | Status: AC

## 2022-04-03 MED ORDER — CEFAZOLIN SODIUM-DEXTROSE 2-4 GM/100ML-% IV SOLN: 2.0000 g | Freq: Four times a day (QID) | INTRAVENOUS | Status: DC

## 2022-04-03 MED ORDER — DOCUSATE SODIUM 100 MG PO CAPS
100.0000 mg | ORAL_CAPSULE | Freq: Two times a day (BID) | ORAL | Status: DC
  Administered 2022-04-03 – 2022-04-04 (×2): 100 mg via ORAL
  Filled 2022-04-03 (×2): qty 1

## 2022-04-03 MED ORDER — KETOROLAC TROMETHAMINE 15 MG/ML IJ SOLN
INTRAMUSCULAR | Status: AC
  Filled 2022-04-03: qty 1

## 2022-04-03 MED ORDER — PHENYLEPHRINE HCL (PRESSORS) 10 MG/ML IV SOLN
INTRAVENOUS | Status: AC
  Filled 2022-04-03: qty 1

## 2022-04-03 MED ORDER — APIXABAN 2.5 MG PO TABS: 2.5000 mg | ORAL_TABLET | Freq: Two times a day (BID) | ORAL | 0 refills | Status: DC

## 2022-04-03 MED ORDER — GABAPENTIN 300 MG PO CAPS
300.0000 mg | ORAL_CAPSULE | Freq: Every day | ORAL | Status: DC
  Administered 2022-04-03: 300 mg via ORAL
  Filled 2022-04-03: qty 1

## 2022-04-03 MED ORDER — OXYCODONE HCL 5 MG PO TABS
5.0000 mg | ORAL_TABLET | ORAL | Status: DC | PRN
  Administered 2022-04-03: 5 mg via ORAL

## 2022-04-03 MED ORDER — DIPHENHYDRAMINE HCL 12.5 MG/5ML PO ELIX: 12.5000 mg | ORAL_SOLUTION | ORAL | Status: DC | PRN

## 2022-04-03 MED ORDER — FAMOTIDINE 20 MG PO TABS
ORAL_TABLET | ORAL | Status: AC
  Administered 2022-04-03: 20 mg via ORAL
  Filled 2022-04-03: qty 1

## 2022-04-03 MED ORDER — DEXAMETHASONE SODIUM PHOSPHATE 10 MG/ML IJ SOLN
INTRAMUSCULAR | Status: DC | PRN
  Administered 2022-04-03: 10 mg via INTRAVENOUS

## 2022-04-03 MED ORDER — ACETAMINOPHEN 10 MG/ML IV SOLN
INTRAVENOUS | Status: AC
  Filled 2022-04-03: qty 100

## 2022-04-03 MED ORDER — SODIUM CHLORIDE 0.9% FLUSH: INTRAVENOUS | Status: DC | PRN

## 2022-04-03 MED ORDER — TRANEXAMIC ACID 1000 MG/10ML IV SOLN
INTRAVENOUS | Status: DC | PRN
  Administered 2022-04-03: 1000 mg via TOPICAL

## 2022-04-03 MED ORDER — OXYCODONE HCL 5 MG PO TABS: 5.0000 mg | ORAL_TABLET | ORAL | 0 refills | Status: DC | PRN

## 2022-04-03 MED ORDER — ONDANSETRON HCL 4 MG/2ML IJ SOLN
INTRAMUSCULAR | Status: AC
  Filled 2022-04-03: qty 2

## 2022-04-03 MED ORDER — OXYCODONE HCL 5 MG PO TABS: 5.0000 mg | ORAL_TABLET | Freq: Once | ORAL | Status: AC | PRN

## 2022-04-03 MED ORDER — ACETAMINOPHEN 10 MG/ML IV SOLN: 1000.0000 mg | Freq: Once | INTRAVENOUS | Status: DC | PRN

## 2022-04-03 MED ORDER — CEFAZOLIN SODIUM-DEXTROSE 2-4 GM/100ML-% IV SOLN
2.0000 g | INTRAVENOUS | Status: AC
  Administered 2022-04-03: 2 g via INTRAVENOUS

## 2022-04-03 MED ORDER — KETOROLAC TROMETHAMINE 15 MG/ML IJ SOLN
15.0000 mg | Freq: Once | INTRAMUSCULAR | Status: AC
  Administered 2022-04-03: 15 mg via INTRAVENOUS

## 2022-04-03 MED ORDER — TRANEXAMIC ACID 1000 MG/10ML IV SOLN
INTRAVENOUS | Status: AC
  Filled 2022-04-03: qty 10

## 2022-04-03 MED ORDER — SODIUM CHLORIDE FLUSH 0.9 % IV SOLN
INTRAVENOUS | Status: AC
  Filled 2022-04-03: qty 40

## 2022-04-03 MED ORDER — TRIAMCINOLONE ACETONIDE 40 MG/ML IJ SUSP
INTRAMUSCULAR | Status: AC
  Filled 2022-04-03: qty 2

## 2022-04-03 MED ORDER — TIZANIDINE HCL 2 MG PO TABS: 2.0000 mg | ORAL_TABLET | Freq: Three times a day (TID) | ORAL | Status: DC | PRN

## 2022-04-03 MED ORDER — PHENYLEPHRINE HCL (PRESSORS) 10 MG/ML IV SOLN
INTRAVENOUS | Status: DC | PRN
  Administered 2022-04-03: 40 ug via INTRAVENOUS
  Administered 2022-04-03 (×2): 80 ug via INTRAVENOUS
  Administered 2022-04-03 (×2): 40 ug via INTRAVENOUS
  Administered 2022-04-03: 80 ug via INTRAVENOUS

## 2022-04-03 MED ORDER — CEFAZOLIN SODIUM-DEXTROSE 2-4 GM/100ML-% IV SOLN
INTRAVENOUS | Status: AC
  Filled 2022-04-03: qty 100

## 2022-04-03 MED ORDER — FENTANYL CITRATE (PF) 100 MCG/2ML IJ SOLN
INTRAMUSCULAR | Status: DC | PRN
  Administered 2022-04-03 (×2): 50 ug via INTRAVENOUS

## 2022-04-03 MED ORDER — FAMOTIDINE 20 MG PO TABS: 20.0000 mg | ORAL_TABLET | Freq: Once | ORAL | Status: AC

## 2022-04-03 MED ORDER — OXYCODONE HCL 5 MG PO TABS
ORAL_TABLET | ORAL | Status: AC
  Administered 2022-04-03: 5 mg via ORAL
  Filled 2022-04-03: qty 1

## 2022-04-03 MED ORDER — ACETAMINOPHEN 325 MG PO TABS: 325.0000 mg | ORAL_TABLET | Freq: Four times a day (QID) | ORAL | Status: DC | PRN

## 2022-04-03 MED ORDER — 0.9 % SODIUM CHLORIDE (POUR BTL) OPTIME
TOPICAL | Status: DC | PRN
  Administered 2022-04-03: 500 mL

## 2022-04-03 MED ORDER — LIDOCAINE HCL (CARDIAC) PF 100 MG/5ML IV SOSY
PREFILLED_SYRINGE | INTRAVENOUS | Status: DC | PRN
  Administered 2022-04-03: 80 mg via INTRAVENOUS

## 2022-04-03 MED ORDER — OXYCODONE HCL 5 MG PO TABS
5.0000 mg | ORAL_TABLET | ORAL | Status: DC | PRN
  Administered 2022-04-03: 5 mg via ORAL
  Filled 2022-04-03: qty 1

## 2022-04-03 MED ORDER — ONDANSETRON HCL 4 MG/2ML IJ SOLN
INTRAMUSCULAR | Status: DC | PRN
  Administered 2022-04-03: 4 mg via INTRAVENOUS

## 2022-04-03 MED ORDER — METOCLOPRAMIDE HCL 10 MG PO TABS: 5.0000 mg | ORAL_TABLET | Freq: Three times a day (TID) | ORAL | Status: DC | PRN

## 2022-04-03 MED ORDER — DIPHENHYDRAMINE HCL 25 MG PO CAPS: 50.0000 mg | ORAL_CAPSULE | Freq: Every evening | ORAL | Status: DC | PRN

## 2022-04-03 MED ORDER — SODIUM CHLORIDE 0.9 % IR SOLN
Status: DC | PRN
  Administered 2022-04-03: 3000 mL

## 2022-04-03 MED ORDER — MAGNESIUM HYDROXIDE 400 MG/5ML PO SUSP: 30.0000 mL | Freq: Every day | ORAL | Status: DC | PRN

## 2022-04-03 MED ORDER — HYDROMORPHONE HCL 1 MG/ML IJ SOLN
INTRAMUSCULAR | Status: DC | PRN
  Administered 2022-04-03 (×2): .5 mg via INTRAVENOUS

## 2022-04-03 SURGICAL SUPPLY — 64 items
APL PRP STRL LF DISP 70% ISPRP (MISCELLANEOUS) ×2
BIT DRILL 2.4 (BIT) ×1
BIT DRILL POLYAX 4.5 (BIT) IMPLANT
BIT DRILL QC 2.4 MINI 80 (BIT) IMPLANT
BLADE SAGITTAL WIDE XTHICK NO (BLADE) ×1 IMPLANT
BLADE SURG SZ20 CARB STEEL (BLADE) ×1 IMPLANT
CHLORAPREP W/TINT 26 (MISCELLANEOUS) ×1 IMPLANT
DRAPE 3/4 80X56 (DRAPES) ×1 IMPLANT
DRAPE IMP U-DRAPE 54X76 (DRAPES) IMPLANT
DRAPE INCISE IOBAN 66X60 STRL (DRAPES) ×1 IMPLANT
DRAPE SURG 17X11 SM STRL (DRAPES) ×2 IMPLANT
DRILL BIT 2.4MM (BIT) ×1
DRILL BIT 4.5MM (BIT) ×1
DRSG MEPILEX SACRM 8.7X9.8 (GAUZE/BANDAGES/DRESSINGS) ×1 IMPLANT
DRSG OPSITE POSTOP 4X10 (GAUZE/BANDAGES/DRESSINGS) ×1 IMPLANT
DRSG OPSITE POSTOP 4X12 (GAUZE/BANDAGES/DRESSINGS) IMPLANT
ELECT CAUTERY BLADE 6.4 (BLADE) ×1 IMPLANT
GAUZE 4X4 16PLY ~~LOC~~+RFID DBL (SPONGE) ×1 IMPLANT
GAUZE XEROFORM 1X8 LF (GAUZE/BANDAGES/DRESSINGS) ×1 IMPLANT
GLOVE BIO SURGEON STRL SZ7.5 (GLOVE) ×4 IMPLANT
GLOVE BIO SURGEON STRL SZ8 (GLOVE) ×4 IMPLANT
GLOVE BIOGEL PI IND STRL 8 (GLOVE) ×1 IMPLANT
GLOVE INDICATOR 8.0 STRL GRN (GLOVE) ×1 IMPLANT
GOWN STRL REUS W/ TWL LRG LVL3 (GOWN DISPOSABLE) ×1 IMPLANT
GOWN STRL REUS W/ TWL XL LVL3 (GOWN DISPOSABLE) ×1 IMPLANT
GOWN STRL REUS W/TWL LRG LVL3 (GOWN DISPOSABLE) ×2
GOWN STRL REUS W/TWL XL LVL3 (GOWN DISPOSABLE) ×1
HANDLE YANKAUER SUCT OPEN TIP (MISCELLANEOUS) ×1 IMPLANT
HEAD CERAMIC BIOLOX 32MM (Head) IMPLANT
HIP SHELL ACETAB 3H 48MM C (Hips) ×1 IMPLANT
HIP SLEEVE BIOLOX -6MM OFFSET (Sleeve) ×1 IMPLANT
HOLSTER ELECTROSUGICAL PENCIL (MISCELLANEOUS) ×1 IMPLANT
HOOD PEEL AWAY T7 (MISCELLANEOUS) ×3 IMPLANT
IMMBOLIZER KNEE 19 BLUE UNIV (SOFTGOODS) IMPLANT
IV NS IRRIG 3000ML ARTHROMATIC (IV SOLUTION) ×1 IMPLANT
KIT TURNOVER KIT A (KITS) ×1 IMPLANT
LINER ACE G7 32 SZC HIGH WALL (Liner) IMPLANT
MANIFOLD NEPTUNE II (INSTRUMENTS) ×1 IMPLANT
NDL FILTER BLUNT 18X1 1/2 (NEEDLE) ×1 IMPLANT
NDL SAFETY ECLIP 18X1.5 (MISCELLANEOUS) ×2 IMPLANT
NDL SPNL 20GX3.5 QUINCKE YW (NEEDLE) ×1 IMPLANT
NEEDLE FILTER BLUNT 18X1 1/2 (NEEDLE) ×1 IMPLANT
NEEDLE SPNL 20GX3.5 QUINCKE YW (NEEDLE) ×1 IMPLANT
PACK HIP PROSTHESIS (MISCELLANEOUS) ×1 IMPLANT
PENCIL SMOKE EVACUATOR (MISCELLANEOUS) ×1 IMPLANT
PIN STEINMAN 3/16 (PIN) ×1 IMPLANT
PULSAVAC PLUS IRRIG FAN TIP (DISPOSABLE) ×1
SHELL ACETAB HIP 3H 48MM C (Hips) IMPLANT
SLEEVE HIP BIOLOX -6MM OFFSET (Sleeve) IMPLANT
SPONGE T-LAP 18X18 ~~LOC~~+RFID (SPONGE) ×5 IMPLANT
STAPLER SKIN PROX 35W (STAPLE) ×1 IMPLANT
STEM COLLARLESS FULL 11X135 (Stem) IMPLANT
SUT TICRON 2-0 30IN 311381 (SUTURE) ×3 IMPLANT
SUT VIC AB 0 CT1 36 (SUTURE) ×1 IMPLANT
SUT VIC AB 1 CT1 36 (SUTURE) ×1 IMPLANT
SUT VIC AB 2-0 CT1 (SUTURE) ×3 IMPLANT
SYR 10ML LL (SYRINGE) ×1 IMPLANT
SYR 20ML LL LF (SYRINGE) ×1 IMPLANT
SYR 30ML LL (SYRINGE) ×2 IMPLANT
TAPE TRANSPORE STRL 2 31045 (GAUZE/BANDAGES/DRESSINGS) ×1 IMPLANT
TIP FAN IRRIG PULSAVAC PLUS (DISPOSABLE) ×1 IMPLANT
TRAP FLUID SMOKE EVACUATOR (MISCELLANEOUS) ×2 IMPLANT
WATER STERILE IRR 1000ML POUR (IV SOLUTION) ×1 IMPLANT
WATER STERILE IRR 500ML POUR (IV SOLUTION) ×1 IMPLANT

## 2022-04-03 NOTE — TOC Progression Note (Signed)
Transition of Care [facility]) - Progression Note    Patient Details  Name: Lynn Finley MRN: [id number] Date of Birth: March 09, 1955  [facility] CM/SW Contact  Conception Oms, RN Phone Number: 04/03/2022, 10:33 AM  Clinical Narrative:     [place] is set up for [facility] services prior to Surgery by Surgeons office RW and 3 in 1 is being delivered to the bedside by [employer] Mour[facility] with [employer] aware   Expected Discharge Plan: [place] Barriers to Discharge: No Barriers Identified  Expected Discharge Plan and Services   Discharge Planning Services: CM Consult Post Acute Care Choice: [place] arrangements for the past 2 months: Single Family Home Expected Discharge Date: 04/03/22               DME Arranged: Gilford Rile rolling, 3-N-1 DME Agency: [employer] Date DME Agency Contacted: 04/03/22 Time DME Agency Contacted: 1032 Representative spoke with at DME Agency: Cyril Mourning HH Arranged: PT, OT [place] Agency: [place] Date [place]: 04/03/22 Time [place]: 1032 Representative spoke with at [place]: [place]   Social Determinants of Health ([place]) Interventions SDOH Screenings   Tobacco Use: Low Risk  (04/03/2022)    Readmission Risk Interventions     No data to display

## 2022-04-03 NOTE — Anesthesia Procedure Notes (Signed)
Procedure Name: Intubation Date/Time: 04/03/2022 7:51 AM  Performed by: Arita Miss, MDPre-anesthesia Checklist: Patient identified, Emergency Drugs available, Suction available and Patient being monitored Patient Re-evaluated:Patient Re-evaluated prior to induction Oxygen Delivery Method: Circle system utilized Preoxygenation: Pre-oxygenation with 100% oxygen Induction Type: IV induction Ventilation: Mask ventilation without difficulty Laryngoscope Size: Mac and 3 Grade View: Grade I Tube type: Oral Tube size: 6.5 mm Placement Confirmation: ETT inserted through vocal cords under direct vision Secured at: 20 cm Tube secured with: Tape Comments: Lacreshia Bondarenko SRNA preformed

## 2022-04-03 NOTE — Op Note (Signed)
04/03/2022  11:01 AM  Patient:   Lynn Finley  Pre-Op Diagnosis:   Degenerative joint disease, left hip.  Post-Op Diagnosis:   Same.  Procedure:   Left total hip arthroplasty.  Surgeon:   Pascal Lux, MD  Assistant:   Cameron Proud, PA-C; Laural Benes, PA-S  Anesthesia:   GET  Findings:   As above.  Complications:   None  EBL:   250 cc  Fluids:   1000 cc crystalloid  UOP:   None  TT:   None  Drains:   None  Closure:   Staples  Implants:   Biomet press-fit system with a #11 laterally offset Echo femoral stem, a 48 mm acetabular shell with an E-poly hi-wall liner, and a 32 mm ceramic head with a -6 mm neck.  Brief Clinical Note:   The patient is a 67 year old female with a history of progressively worsening left hip/groin pain. Her symptoms have progressed despite medications, activity modification, etc. Her history and examination are consistent with degenerative joint disease of the left hip, suggested by plain radiographs and confirmed by MRI scan. The patient presents at this time for a left total hip arthroplasty.   Procedure:   The patient was brought into the operating room and laid in the supine position. After adequate general endotracheal intubation and anesthesia was obtained, the patient was repositioned in the right lateral decubitus position and secured using a lateral hip positioner. The left hip and lower extremity were prepped with ChloroPrep solution before being draped sterilely. Preoperative antibiotics were administered. A timeout was performed to verify the appropriate surgical site.    A standard posterior approach to the hip was made through an approximately 7-8 inch incision. The incision was carried down through the subcutaneous tissues to expose the gluteal fascia and proximal end of the iliotibial band. These structures were split the length of the incision and the Charnley self-retaining hip retractor placed. The bursal tissues were swept  posteriorly to expose the short external rotators. The anterior border of the piriformis tendon was identified and this plane developed down through the capsule to enter the joint. A flap of tissue was elevated off the posterior aspect of the femoral neck and greater trochanter and retracted posteriorly. This flap included the piriformis tendon, the short external rotators, and the posterior capsule. The soft tissues were elevated off the lateral aspect of the ilium and a large Steinmann pin placed bicortically.   With the left leg aligned over the right, a drill bit was placed into the greater trochanter parallel to the Steinmann pin and the distance between these two pins measured in order to optimize leg lengths postoperatively. The drill bit was removed and the hip dislocated. The piriformis fossa was debrided of soft tissues before the intramedullary canal was accessed through this point using a triple step reamer. The canal was reamed sequentially beginning with a #7 tapered reamer and progressing to a #11 tapered reamer. This provided excellent circumferential chatter. Using the appropriate guide, a femoral neck cut was made 10-12 mm above the lesser trochanter. The femoral head was removed.  Attention was directed to the acetabular side. The labrum was debrided circumferentially before the ligamentum teres was removed using a large curette. A line was drawn on the drapes corresponding to the native version of the acetabulum. This line was used as a guide while the acetabulum was reamed sequentially beginning with a 45 mm reamer and progressing to a 47 mm reamer. This provided excellent circumferential  chatter. The 48 mm trial acetabulum was positioned and found to fit quite well. Therefore, the 48 mm acetabular shell was selected and impacted into place with care taken to maintain the appropriate version. The trial high wall liner was inserted.  Attention was redirected to the femoral side. A box  osteotome was used to establish version before the canal was broached sequentially beginning with a #7 broach and progressing to a #11 broach. This was left in place and several trial reductions performed using both the standard and laterally offset neck options, as well as the -3 mm and -6 mm neck lengths. After removing the trial components, the "manhole cover" was placed into the apex of the acetabular shell and tightened securely. The permanent E-polyethylene hi-wall liner was impacted into the acetabular shell and its locking mechanism verified using a quarter-inch osteotome. Next, the #11 laterally offset femoral stem was impacted into place with care taken to maintain the appropriate version. A repeat trial reduction was performed using the -6 mm neck length. The -6 mm neck length demonstrated excellent stability both in extension and external rotation as well as with flexion to 90 and internal rotation beyond 70. It also was stable in the position of sleep. In addition, leg lengths appeared to be restored appropriately, both by reassessing the position of the right leg over the left, as well as by measuring the distance between the Steinmann pin and the drill bit. The 32 mm ceramic head with the -6 mm neck was impacted onto the stem of the femoral component. The Morse taper locking mechanism was verified using manual distraction before the head was relocated and placed through a range of motion with the findings as described above.  The wound was copiously irrigated with sterile saline solution via the jet lavage system before the peri-incisional and pericapsular tissues were injected with a "cocktail" of 20 cc of Exparel, 30 cc of 0.5% Sensorcaine, 2 cc of Kenalog 40 (80 mg), and 30 mg of Toradol diluted out to 90 cc with normal saline to help with postoperative analgesia. The posterior flap was reapproximated to the posterior aspect of the greater trochanter using #2 Tycron interrupted sutures placed  through drill holes. Several additional #2 Tycron interrupted sutures were used to reinforce this layer of closure. The iliotibial band was reapproximated using #1 Vicryl interrupted sutures before the gluteal fascia was closed using a running #1 Vicryl suture. At this point, 1 g of transexemic acid in 10 cc of normal saline was injected into the joint to help reduce postoperative bleeding. The subcutaneous tissues were closed in several layers using 2-0 Vicryl interrupted sutures before the skin was closed using staples. A sterile occlusive dressing was applied to the wound. The patient was then rolled back into the supine position on her hospital bed before being awakened, extubated, and returned to the recovery room in satisfactory condition after tolerating the procedure well.

## 2022-04-03 NOTE — Progress Notes (Signed)
Patient is not able to walk the distance required to go the bathroom, or he/she is unable to safely negotiate stairs required to access the bathroom.  A 3in1 BSC will alleviate this problem  

## 2022-04-03 NOTE — Discharge Instructions (Addendum)
Orthopedic discharge instructions: May sponge bathe or shower with intact OpSite dressing. Apply ice frequently to hip. Start Eliquis 1 tablet (2.5 mg) twice daily on Wednesday, 04/04/2022, for 2 weeks, then take aspirin 325 mg twice daily for 4 weeks. Take pain medication as prescribed when needed.  May supplement with ES Tylenol if necessary. May weight-bear as tolerated on left leg - use walker for balance and support. Remain aware of posterior hip precautions. Follow-up in 10-14 days or as scheduled.  AMBULATORY SURGERY  DISCHARGE INSTRUCTIONS   The drugs that you were given will stay in your system until tomorrow so for the next 24 hours you should not:  Drive an automobile Make any legal decisions Drink any alcoholic beverage   You may resume regular meals tomorrow.  Today it is better to start with liquids and gradually work up to solid foods.  You may eat anything you prefer, but it is better to start with liquids, then soup and crackers, and gradually work up to solid foods.   Please notify your doctor immediately if you have any unusual bleeding, trouble breathing, redness and pain at the surgery site, drainage, fever, or pain not relieved by medication.    Additional Instructions:  PLEASE LEAVE TEAL ARMBAND ON FOR 4 DAYS    Please contact your physician with any problems or Same Day Surgery at [phone], Monday through Friday 6 am to 4 pm, or [place] at [facility] number at [phone].

## 2022-04-03 NOTE — Anesthesia Preprocedure Evaluation (Signed)
Anesthesia Evaluation  Patient identified by MRN, date of birth, ID band Patient awake    Reviewed: Allergy & Precautions, NPO status , Patient's Chart, lab work & pertinent test results  History of Anesthesia Complications Negative for: history of anesthetic complications  Airway Mallampati: II  TM Distance: >3 FB Neck ROM: Full    Dental no notable dental hx. (+) Teeth Intact   Pulmonary neg pulmonary ROS, neg sleep apnea, neg COPD, Patient abstained from smoking.Not current smoker   Pulmonary exam normal breath sounds clear to auscultation       Cardiovascular Exercise Tolerance: Good METShypertension, Pt. on medications (-) CAD and (-) Past MI (-) dysrhythmias  Rhythm:Regular Rate:Normal - Systolic murmurs    Neuro/Psych negative neurological ROS  negative psych ROS   GI/Hepatic ,GERD  Controlled,,(+)     (-) substance abuse    Endo/Other  neg diabetes    Renal/GU negative Renal ROS     Musculoskeletal  (+) Arthritis ,    Abdominal   Peds  Hematology   Anesthesia Other Findings Past Medical History: No date: Chicken pox 12/2021: COVID No date: DDD (degenerative disc disease), cervical 01/2022: EBV infection No date: GERD (gastroesophageal reflux disease) No date: Hypercholesterolemia No date: Hypertension  Reproductive/Obstetrics                              Anesthesia Physical Anesthesia Plan  ASA: 2  Anesthesia Plan: General   Post-op Pain Management: Ofirmev IV (intra-op)*   Induction: Intravenous  PONV Risk Score and Plan: 4 or greater and Ondansetron, Dexamethasone and Midazolam  Airway Management Planned: Oral ETT  Additional Equipment: None  Intra-op Plan:   Post-operative Plan: Extubation in OR  Informed Consent: I have reviewed the patients History and Physical, chart, labs and discussed the procedure including the risks, benefits and alternatives for  the proposed anesthesia with the patient or authorized representative who has indicated his/her understanding and acceptance.     Dental advisory given  Plan Discussed with: CRNA and Surgeon  Anesthesia Plan Comments: (Discussed r/b/a of spinal anesthesia vs GETA. Patient had poor prior experiences with spinals with two Cesareans in the past, and she declines spinal.  Discussed risks of anesthesia with patient, including PONV, sore throat, lip/dental/eye damage, urinary retention (patient specifically brought this up). Rare risks discussed as well, such as cardiorespiratory and neurological sequelae, and allergic reactions. Discussed the role of CRNA in patient's perioperative care. Patient understands.)         Anesthesia Quick Evaluation

## 2022-04-03 NOTE — H&P (Signed)
History of Present Illness: Lynn Finley is a 67 y.o. female who presents today for her surgical history and physical for upcoming left total hip arthroplasty scheduled Dr. Roland Rack on 04/03/2022. The patient denies any changes in her medical history since she was last evaluated. She denies any falls or trauma affecting the left hip since her last evaluation. The patient continues report significant pain left hip especially with prolonged periods of walking and standing. Pain score today is a 7 out of 10 in the left hip. The patient continues take over-the-counter medications, she has stopped her Aleve at this time. The patient denies any personal history of heart attack, stroke, asthma or COPD. No personal history of blood clots. She is not diabetic.  Past Medical History: Chicken pox  GERD (gastroesophageal reflux disease)  Hyperlipidemia  Hypertension   Past Surgical History: Lumpectomy of breast [place]  Neck surgery 2012  COLONOSCOPY N/A 05/31/2011 (Dr. Alfonso Patten. Henihan @ [facility] - Tubular Adenoma)  EGD N/A 07/09/2012 (Dr. Alfonso Patten. Henihan @ [facility])  ROTATOR CUFF SURGERY Right 2015  COLONOSCOPY 04/25/2020 (Diverticulosis/PHx CP/Repeat 49yrs/CTL)  CESAREAN SECTION 1977, 1979, 1981  CHOLECYSTECTOMY   Past Family History: Heart disease Mother  Arthritis Mother  Diabetes type I Brother  Seizures Brother   Medications: acetaminophen (TYLENOL) 500 MG tablet Take by mouth  bisoproloL-hydroCHLOROthiazide (ZIAC) 5-6.25 mg tablet TAKE 1 TABLET BY MOUTH ONCE DAILY 100 tablet 2  cholecalciferol (VITAMIN D3) 1000 unit tablet Take 1,000 Units by mouth  cyanocobalamin (VITAMIN B12) 1000 MCG tablet Take 1,000 mcg by mouth once daily  diphenhydrAMINE (BENADRYL) 50 MG capsule Take 1 capsule by mouth at bedtime  docusate (COLACE) 50 MG capsule Take 50 mg by mouth 2 (two) times daily  estradioL (ESTRACE) 0.01 % (0.1 mg/gram) vaginal cream Place 1 g  vaginally twice a week 45 g 3  gabapentin (NEURONTIN) 300 MG capsule Take 2 capsules (600 mg total) by mouth at bedtime (Patient taking differently: Take 300 mg by mouth at bedtime) 180 capsule 3  omeprazole (PRILOSEC) 40 MG DR capsule Take 1 capsule (40 mg total) by mouth at bedtime 90 capsule 0  polyethylene glycol (MIRALAX) packet Take by mouth  psyllium, sugar, (METAMUCIL) 3.4 gram packet Take 1 packet by mouth once daily.  tiZANidine (ZANAFLEX) 2 MG capsule Take by mouth  traMADoL (ULTRAM) 50 mg tablet Take 1 tablet (50 mg total) by mouth every 8 (eight) hours as needed 21 tablet 0   Allergies: No Known Allergies   Review of Systems:  A comprehensive 14 point ROS was performed, reviewed by me today, and the pertinent orthopaedic findings are documented in the HPI.  Physical Exam: BP 116/68  Ht 167.6 cm (5\' 6" )  Wt 81.6 kg (180 lb)  BMI 29.05 kg/m  General/Constitutional: The patient appears to be well-nourished, well-developed, and in no acute distress. Neuro/Psych: Normal mood and affect, oriented to person, place and time. Eyes: Non-icteric. Pupils are equal, round, and reactive to light, and exhibit synchronous movement. ENT: Unremarkable. Lymphatic: No palpable adenopathy. Respiratory: Lungs clear to auscultation, Normal chest excursion, No wheezes, and Non-labored breathing Cardiovascular: Regular rate and rhythm. No murmurs. and No edema, swelling or tenderness, except as noted in detailed exam. Integumentary: No impressive skin lesions present, except as noted in detailed exam. Musculoskeletal: Unremarkable, except as noted in detailed exam.  Lumbar exam: Skin inspection of the lower back remains unremarkable. She is able to arise from a seated position with  mild discomfort. In stance, her spine is straight and her pelvis is level. She demonstrates a mildly antalgic gait, and uses a cane for balance and support. There is no tenderness along the thoracic or lumbar spine, nor  across the sacrum. There also is no pain over either trochanteric region, either SI joint region, or either sciatic notch region. She can heel raise and toe raise with mild difficulty in balance, but with good strength.   Hip exam: She is able to tolerate flexion to 100 degrees, internal rotation to 15 degrees, and external rotation to 35 degrees on the right and flexion to 90 degrees, internal rotation to 10 degrees, and external rotation to 35 degrees on the left. She experiences moderate pain with flexion and internal rotation in both hips. She again is neurovascularly intact to both lower extremities, and has negative sitting straight leg raises bilaterally.  Imaging: A recent MRI scan of the pelvis and right hip is available for review and has been reviewed by myself. By report, the study demonstrates evidence of moderate degenerative changes with areas of full-thickness articular cartilage loss involving both the femoral head and acetabulum. In addition, there is complex tearing of the superior portion of the labrum. No significant bony, ligamentous, or muscle/tendon abnormalities are identified.   A recent MRI scan of the pelvis and left hip is available for review and has been reviewed by myself. By report, the study demonstrates evidence of moderate degenerative changes with areas of full-thickness articular cartilage loss involving both the femoral head and acetabulum. In addition, there is complex tearing the superior portion of the labrum. No significant bony, ligamentous, or muscle tendon abnormalities are identified.   Impression: Primary osteoarthritis of left hip.  Plan:  1. Treatment options were discussed today with the patient. 2. The patient is scheduled for a left total hip arthroplasty with Dr. Roland Rack on 04/03/2022. 3. The patient was instructed on the risk and benefits of surgical intervention and wishes to proceed at this time. 4. This document will serve as a surgical history  and physical for the patient. 5. The patient will follow-up per standard postop protocol. They can call the clinic they have any questions, new symptoms develop or symptoms worsen.  The procedure was discussed with the patient, as were the potential risks (including bleeding, infection, nerve and/or blood vessel injury, persistent or recurrent pain, failure of the hardware, leg length inequality, dislocation, heterotopic ossification, need for further surgery, blood clots, strokes, heart attacks and/or arhythmias, pneumonia, etc.) and benefits. The patient states her understanding and wishes to proceed.    H&P reviewed and patient re-examined. No changes.

## 2022-04-03 NOTE — Evaluation (Signed)
Physical Therapy Evaluation Patient Details Name: Lynn Finley MRN: [id number] DOB: [birth year] Today's Date: 04/03/2022  History of Present Illness  Lynn Finley is a 30yoF who comes [facility] for elective Left THA, posterolateral approach with Dr. Roland Rack, pt WBAT after procedure.  Clinical Impression  Pt asleep in PACU bay on arrival, husband at bedside. Pt agreeable to session. Slight nausea noted 10 minutes into session, author took time to assess supine v seated upright vitals while providing caregiver education on precautions- pressures are essentially flat with a mild drop in HR supine 58bpm to seated HR 54bpm. Pt given antiemetics which appears to work well, no additional transient nausea in session. Of note- pt does reports vertigo upon transition to EOB, author notes nystagmus, symptoms resolve within 30sec. Pt denies any history of baseline vertigo, however quick resolution of symptoms is consistent with BPPV presentation, not consistent with central etiologies. Husband educated on assisting pt to Left EOB. Pt able to rise to standing with repeated cues for precautions and hand placement, rises fully but has a near immediate buckling to Left knee that requires intervention from author to present collapse to floor. Pt has multiple other episodes of Left knee buckle in remaining session including marching in place with RW and step-pivot transfer to [facility] for voiding. Gait and stairs deferred to a more appropriate time. Pt left in care of NSG. Surgeon contacted regarding knee buckling issue. OT orders put in for ADL education. Will continue to follow.      Recommendations for follow up therapy are one component of a multi-disciplinary discharge planning process, led by the attending physician.  Recommendations may be updated based on patient status, additional functional criteria and insurance authorization.  Follow Up Recommendations Follow physician's recommendations for discharge plan and  follow up therapies      Assistance Recommended at Discharge Intermittent Supervision/Assistance  Patient can return home with the following  Two people to help with walking and/or transfers;A little help with bathing/dressing/bathroom;Assistance with cooking/housework;Help with stairs or ramp for entrance;Assist for transportation    Equipment Recommendations BSC/3in1 (pt already has a RW)  Recommendations for Other Services       Functional Status Assessment Patient has had a recent decline in their functional status and demonstrates the ability to make significant improvements in function in a reasonable and predictable amount of time.     Precautions / Restrictions Precautions Precautions: Fall;Posterior Hip Restrictions Weight Bearing Restrictions: Yes LLE Weight Bearing: Weight bearing as tolerated      Mobility  Bed Mobility Overal bed mobility: Needs Assistance Bed Mobility: Supine to Sit     Supine to sit: Min assist     General bed mobility comments: husband assists minA of LLE and modA of trunk with 1 hand grip assist, also monitors precautions.    Transfers Overall transfer level: Needs assistance Equipment used: Rolling walker (2 wheels) Transfers: Sit to/from Stand, Bed to chair/wheelchair/BSC Sit to Stand: Min assist, Total assist   Step pivot transfers: Mod assist (no device used, recurrent LLE bucklling arrests by tibial block)       General transfer comment: generally labored, difficult, immediate left buckling which author arrests with max-totalA tibial blocking. cues needed for safe adhereance to precautions    Ambulation/Gait Ambulation/Gait assistance:  (unsafe to attempt at this time, will defer to later session.)                Stairs            Wheelchair Mobility  Modified Rankin (Stroke Patients Only)       Balance                                             Pertinent Vitals/Pain Pain  Assessment Pain Assessment: No/denies pain    Home Living Family/patient expects to be discharged to:: Private residence Living Arrangements: Spouse/significant other Available Help at Discharge: Family Type of Home: House Home Access: Stairs to enter   [employer] of Steps: 5 c 1 rail in garage, 7-8 steps in front with   Home Layout: Two level;Able to live on main level with bedroom/bathroom Home Equipment: Rolling Walker (2 wheels);Cane - single point      Prior Function Prior Level of Function : Independent/Modified Independent                     Hand Dominance        Extremity/Trunk Assessment        Lower Extremity Assessment Lower Extremity Assessment: LLE deficits/detail LLE Deficits / Details: quads not working post surgery, recurrent buckling, paresthesias at left anterior thigh; LLE below knee to toes appears neurologically intact       Communication      Cognition Arousal/Alertness: Awake/alert Behavior During Therapy: Flat affect Overall Cognitive Status: Difficult to assess                                 General Comments: flat affect and less frequent response to questions from provider and/or staff, does not appear especially altered or confused        General Comments      Exercises     Assessment/Plan    PT Assessment Patient needs continued PT services  PT Problem List Decreased strength;Decreased range of motion;Decreased activity tolerance;Decreased balance;Decreased mobility;Decreased coordination;Decreased knowledge of use of DME;Decreased safety awareness;Impaired sensation;Impaired tone       PT Treatment Interventions DME instruction;Neuromuscular re-education;Gait training;Stair training;Functional mobility training;Therapeutic activities;Therapeutic exercise;Balance training;Patient/family education    PT Goals (Current goals can be found in the Care Plan section)  Acute Rehab PT Goals Patient  Stated Goal: return to home and begin gait retraining PT Goal Formulation: With patient Time For Goal Achievement: 04/17/22 Potential to Achieve Goals: Good    Frequency BID     Co-evaluation               AM-PAC PT "6 Clicks" Mobility  Outcome Measure Help needed turning from your back to your side while in a flat bed without using bedrails?: A Lot Help needed moving from lying on your back to sitting on the side of a flat bed without using bedrails?: A Lot Help needed moving to and from a bed to a chair (including a wheelchair)?: A Lot Help needed standing up from a chair using your arms (e.g., wheelchair or bedside chair)?: A Lot Help needed to walk in hospital room?: Total Help needed climbing 3-5 steps with a railing? : Total 6 Click Score: 10    End of Session   Activity Tolerance: No increased pain;Patient limited by fatigue;Treatment limited secondary to medical complications (Comment) Patient left: in chair;with call bell/phone within reach;with nursing/sitter in room;Other (comment) (on [facility]) Nurse Communication: Mobility status;Precautions PT Visit Diagnosis: Difficulty in walking, not elsewhere classified (R26.2);Other abnormalities of gait and mobility (  R26.89)    [id number] PT Time Calculation (min) (ACUTE ONLY): 41 min   Charges:   PT Evaluation $PT Eval Moderate Complexity: 1 Mod PT Treatments $Self Care/Home Management: 8-22      3:02 PM, 04/03/22 Etta Grandchild, PT, DPT Physical Therapist - [facility]  [phone] ([place])   [place] 04/03/2022, 2:56 PM

## 2022-04-03 NOTE — Plan of Care (Signed)
  Problem: Activity: Goal: Risk for activity intolerance will decrease Outcome: Progressing   Problem: Nutrition: Goal: Adequate nutrition will be maintained Outcome: Progressing   Problem: Coping: Goal: Level of anxiety will decrease Outcome: Progressing   Problem: Pain Managment: Goal: General experience of comfort will improve Outcome: Progressing   

## 2022-04-03 NOTE — Transfer of Care (Signed)
Immediate Anesthesia Transfer of Care Note  Patient: Lynn Finley  Procedure(s) Performed: TOTAL HIP ARTHROPLASTY (Left: Hip)  Patient Location: PACU  Anesthesia Type:General  Level of Consciousness: drowsy  Airway & Oxygen Therapy: Patient Spontanous Breathing and Patient connected to face mask oxygen  Post-op Assessment: Report given to RN and Post -op Vital signs reviewed and stable  Post vital signs: Reviewed  Last Vitals:  Vitals Value Taken Time  BP 130/52 04/03/22 1008  Temp 74F   Pulse 62 04/03/22 1012  Resp 15 04/03/22 1012  SpO2 100 % 04/03/22 1012  Vitals shown include unvalidated device data.  Last Pain:  Vitals:   04/03/22 0628  TempSrc: Temporal  PainSc: 0-No pain         Complications: No notable events documented.

## 2022-04-03 NOTE — Evaluation (Signed)
Occupational Therapy Evaluation Patient Details Name: Lynn Finley MRN: [id number] DOB: 06/30/1955 Today's Date: 04/03/2022   History of Present Illness Lynn Finley is a 69yoF who comes to [facility] for elective Left THA, posterolateral approach with Dr. Roland Rack, pt WBAT after procedure.   Clinical Impression   Lynn Finley was seen for OT evaluation this date. Prior to hospital admission, pt was IND. Pt lives with spouse. Pt presents to acute OT demonstrating impaired ADL performance and functional mobility 2/2 decreased activity tolerance and functional strength/ROM/balance deficits. Pt recalls 2/3 posterior hip pcns at start of session. Initial standing L knee buckling, improves with use of RW, continues to have small buckles intermittently as pt fatigues or fully weight bears on LLE.   Pt currently requires MIN A + RW for BSC t/f. SUPERVISION pericare sitting. MOD A for LB access seated EOB. Pt would benefit from skilled OT to address noted impairments and functional limitations (see below for any additional details). Upon hospital discharge, recommend no OT follow up.   Recommendations for follow up therapy are one component of a multi-disciplinary discharge planning process, led by the attending physician.  Recommendations may be updated based on patient status, additional functional criteria and insurance authorization.   Follow Up Recommendations  No OT follow up     Assistance Recommended at Discharge Intermittent Supervision/Assistance  Patient can return home with the following A little help with walking and/or transfers;A little help with bathing/dressing/bathroom;Help with stairs or ramp for entrance    Functional Status Assessment  Patient has had a recent decline in their functional status and demonstrates the ability to make significant improvements in function in a reasonable and predictable amount of time.  Equipment Recommendations  BSC/3in1    Recommendations for Other  Services       Precautions / Restrictions Precautions Precautions: Fall;Posterior Hip Restrictions Weight Bearing Restrictions: Yes LLE Weight Bearing: Weight bearing as tolerated      Mobility Bed Mobility Overal bed mobility: Needs Assistance Bed Mobility: Supine to Sit, Sit to Supine     Supine to sit: Min guard Sit to supine: Min assist        Transfers Overall transfer level: Needs assistance Equipment used: Rolling walker (2 wheels) Transfers: Sit to/from Stand, Bed to chair/wheelchair/BSC Sit to Stand: Min assist, Min guard     Step pivot transfers: Min assist     General transfer comment: MIN A no AD use improves to CGA + RW from elevated bed (simulated home bed height)      Balance Overall balance assessment: Needs assistance Sitting-balance support: Feet supported, No upper extremity supported Sitting balance-Leahy Scale: Good     Standing balance support: Bilateral upper extremity supported, Reliant on assistive device for balance Standing balance-Leahy Scale: Fair                             ADL either performed or assessed with clinical judgement   ADL Overall ADL's : Needs assistance/impaired                                       General ADL Comments: MIN A + RW for BSC t/f. SUPERVISION pericare sitting. MOD A for LB access seated EOB      Pertinent Vitals/Pain Pain Assessment Pain Assessment: No/denies pain     Hand Dominance     Extremity/Trunk Assessment Upper  Extremity Assessment Upper Extremity Assessment: Overall WFL for tasks assessed   Lower Extremity Assessment Lower Extremity Assessment: Generalized weakness LLE Deficits / Details: quads not working post surgery, recurrent buckling, paresthesias at left anterior thigh; LLE below knee to toes appears neurologically intact       Communication Communication Communication: No difficulties   Cognition Arousal/Alertness: Awake/alert Behavior  During Therapy: WFL for tasks assessed/performed Overall Cognitive Status: Within Functional Limits for tasks assessed                                 General Comments: recalls 2/3 posterior hip pcns                Home Living Family/patient expects to be discharged to:: Private residence Living Arrangements: Spouse/significant other Available Help at Discharge: Family Type of Home: House Home Access: Stairs to enter [employer] of Steps: 5 c 1 rail in garage, 7-8 steps in front with   Home Layout: Two level;Able to live on main level with bedroom/bathroom     Bathroom Shower/Tub: Walk-in shower;Door         Home Equipment: Conservation officer, nature (2 wheels);Cane - single point          Prior Functioning/Environment Prior Level of Function : Independent/Modified Independent                        OT Problem List: Decreased strength;Decreased range of motion;Decreased activity tolerance;Impaired balance (sitting and/or standing);Decreased safety awareness      OT Treatment/Interventions: Self-care/ADL training;Therapeutic exercise;Energy conservation;DME and/or AE instruction;Therapeutic activities;Balance training;Patient/family education    OT Goals(Current goals can be found in the care plan section) Acute Rehab OT Goals Patient Stated Goal: to go home OT Goal Formulation: With patient Time For Goal Achievement: 04/17/22 Potential to Achieve Goals: Good ADL Goals Pt Will Perform Grooming: with modified independence;standing Pt Will Perform Lower Body Dressing: with modified independence;sit to/from stand;with adaptive equipment Pt Will Transfer to Toilet: with modified independence;ambulating;regular height toilet  OT Frequency: Min 2X/week    Co-evaluation              AM-PAC OT "6 Clicks" Daily Activity     Outcome Measure Help from another person eating meals?: None Help from another person taking care of personal grooming?:  A Little Help from another person toileting, which includes using toliet, bedpan, or urinal?: A Little Help from another person bathing (including washing, rinsing, drying)?: A Lot Help from another person to put on and taking off regular upper body clothing?: A Little Help from another person to put on and taking off regular lower body clothing?: A Lot 6 Click Score: 17   End of Session Equipment Utilized During Treatment: Gait belt;Rolling walker (2 wheels) Nurse Communication: Mobility status  Activity Tolerance: Patient tolerated treatment well Patient left: in bed;with call bell/phone within reach  OT Visit Diagnosis: Unsteadiness on feet (R26.81);Muscle weakness (generalized) (M62.81)                Time: [id number] OT Time Calculation (min): 25 min Charges:  OT General Charges $OT Visit: 1 Visit OT Evaluation $OT Eval Low Complexity: 1 Low OT Treatments $Self Care/Home Management : 8-22 mins  Dessie Coma, M.S. OTR/L  04/03/22, 4:46 PM  ascom [phone]

## 2022-04-03 NOTE — Anesthesia Postprocedure Evaluation (Signed)
Anesthesia Post Note  Patient: Lynn Finley  Procedure(s) Performed: TOTAL HIP ARTHROPLASTY (Left: Hip)  Patient location during evaluation: PACU Anesthesia Type: General Level of consciousness: awake and alert Pain management: pain level controlled Vital Signs Assessment: post-procedure vital signs reviewed and stable Respiratory status: spontaneous breathing, nonlabored ventilation, respiratory function stable and patient connected to nasal cannula oxygen Cardiovascular status: blood pressure returned to baseline and stable Postop Assessment: no apparent nausea or vomiting Anesthetic complications: no   No notable events documented.   Last Vitals:  Vitals:   04/03/22 1012 04/03/22 1015  BP:  131/63  Pulse: 62 (!) 56  Resp: 15 14  Temp: (!) 36.1 C   SpO2: 100% 100%    Last Pain:  Vitals:   04/03/22 1012  TempSrc:   PainSc: 0-No pain                 Arita Miss

## 2022-04-04 ENCOUNTER — Encounter: Payer: Self-pay | Admitting: Surgery

## 2022-04-04 ENCOUNTER — Other Ambulatory Visit (HOSPITAL_COMMUNITY): Payer: Self-pay

## 2022-04-04 DIAGNOSIS — M1612 Unilateral primary osteoarthritis, left hip: Secondary | ICD-10-CM | POA: Diagnosis not present

## 2022-04-04 LAB — CBC
HCT: 28.2 % — ABNORMAL LOW (ref 36.0–46.0)
Hemoglobin: 9.5 g/dL — ABNORMAL LOW (ref 12.0–15.0)
MCH: 30 pg (ref 26.0–34.0)
MCHC: 33.7 g/dL (ref 30.0–36.0)
MCV: 89 fL (ref 80.0–100.0)
Platelets: 217 10*3/uL (ref 150–400)
RBC: 3.17 MIL/uL — ABNORMAL LOW (ref 3.87–5.11)
RDW: 12 % (ref 11.5–15.5)
WBC: 9.3 10*3/uL (ref 4.0–10.5)
nRBC: 0 % (ref 0.0–0.2)

## 2022-04-04 MED ORDER — POLYETHYLENE GLYCOL 3350 17 G PO PACK: 17.0000 g | PACK | Freq: Every day | ORAL | Status: DC | PRN

## 2022-04-04 NOTE — Plan of Care (Signed)

## 2022-04-04 NOTE — Progress Notes (Signed)
Physical Therapy Treatment Patient Details Name: Lynn Finley MRN: [id number] DOB: Mar 10, 1955 Today's Date: 04/04/2022   History of Present Illness Lynn Finley is a 34yoF who comes to [facility] for elective Left THA, posterolateral approach with Dr. Roland Rack, pt WBAT after procedure. Pt had some postop weakness of left quads on POD which largely resolved by POD1.    PT Comments    Pt in bed on arrival, breakfast has been executed with precision. Pt agreeable to PT session, pain reported to be at goal (no pain at all). Pt reports substantial return of Left quads function since previous day. Sensation now intact in distal thigh, however pt does report continued numbness medial and lateral to tibial tuberosity (general area) and an inch distal. Post hip precs reviewed in full and remarked upon during activity when relevant. HEP education began with handout visual aid. Pt able to perform bed mobility to EOB right, STS from standard height with RW, and AMB in hallway with multiple turns all without any physical assist. Not until gait is advanced and she finds the LLE in a hip extended closed-chain position at toe off does she still notice some buckling tendencies, however she is able to maintain control of the joint. She is unable to advance to 2-point gait because of this. Pt left up in chair at EOS, husband in room. Will plan on return in PM for finished PT goals of care, most critical remaining is stairs training. Pt progressing well in general.    Recommendations for follow up therapy are one component of a multi-disciplinary discharge planning process, led by the attending physician.  Recommendations may be updated based on patient status, additional functional criteria and insurance authorization.  Follow Up Recommendations  Follow physician's recommendations for discharge plan and follow up therapies     Assistance Recommended at Discharge Intermittent Supervision/Assistance  Patient can return  home with the following Two people to help with walking and/or transfers;A little help with bathing/dressing/bathroom;Assistance with cooking/housework;Help with stairs or ramp for entrance;Assist for transportation   Equipment Recommendations       Recommendations for Other Services       Precautions / Restrictions Precautions Precautions: Fall;Posterior Hip Restrictions Weight Bearing Restrictions: Yes LLE Weight Bearing: Weight bearing as tolerated     Mobility  Bed Mobility Overal bed mobility: Needs Assistance Bed Mobility: Supine to Sit     Supine to sit: Supervision          Transfers Overall transfer level: Needs assistance Equipment used: Rolling walker (2 wheels) Transfers: Sit to/from Stand Sit to Stand: Min guard           General transfer comment: stable LLE from EOB, toilet, and from recliner    Ambulation/Gait   Gait Distance (Feet): 190 Feet Assistive device: Rolling walker (2 wheels) Gait Pattern/deviations: Step-to pattern       General Gait Details: working diligently on a stepthrough gait but transition from 3 point to 2 point is difficult as it reveals remaining quads weakness in a hip extension position at toe-off   Stairs             Wheelchair Mobility    Modified Rankin (Stroke Patients Only)       Balance                                            Cognition Arousal/Alertness: Awake/alert  Behavior During Therapy: WFL for tasks assessed/performed Overall Cognitive Status: Within Functional Limits for tasks assessed                                          Exercises Total Joint Exercises Towel Squeeze: Both, 10 reps, Supine Short Arc Quad: AROM, Left, 15 reps, Supine Heel Slides: AROM, AAROM, Left, 15 reps, Supine Hip ABduction/ADduction: AROM, AAROM, Left, 15 reps, Supine Long Arc Quad: AROM, Left, Seated    General Comments        Pertinent Vitals/Pain Pain  Assessment Pain Assessment: No/denies pain    Home Living                          Prior Function            PT Goals (current goals can now be found in the care plan section) Acute Rehab PT Goals Patient Stated Goal: return to home and begin gait retraining PT Goal Formulation: With patient Time For Goal Achievement: 04/17/22 Potential to Achieve Goals: Good Progress towards PT goals: Progressing toward goals    Frequency    BID      PT Plan Current plan remains appropriate    Co-evaluation              AM-PAC PT "6 Clicks" Mobility   Outcome Measure  Help needed turning from your back to your side while in a flat bed without using bedrails?: A Little Help needed moving from lying on your back to sitting on the side of a flat bed without using bedrails?: A Little Help needed moving to and from a bed to a chair (including a wheelchair)?: A Little Help needed standing up from a chair using your arms (e.g., wheelchair or bedside chair)?: A Little Help needed to walk in hospital room?: A Little Help needed climbing 3-5 steps with a railing? : A Little 6 Click Score: 18    End of Session Equipment Utilized During Treatment: Gait belt Activity Tolerance: No increased pain Patient left: in chair;with nursing/sitter in room;with family/visitor present;with call bell/phone within reach Nurse Communication: Mobility status PT Visit Diagnosis: Difficulty in walking, not elsewhere classified (R26.2);Other abnormalities of gait and mobility (R26.89)     Time: [id number] PT Time Calculation (min) (ACUTE ONLY): 38 min  Charges:  $Gait Training: 8-22 mins $Therapeutic Exercise: 8-22 mins $Therapeutic Activity: 8-22 mins                    11:59 AM, 04/04/22 Etta Grandchild, PT, DPT Physical Therapist - [facility]  [phone] ([place])    Khalee Mazo C 04/04/2022, 11:54 AM

## 2022-04-04 NOTE — Discharge Summary (Signed)
Physician Discharge Summary  Patient ID: Lynn Finley MRN: [id number] DOB/AGE: September 17, 1955 67 y.o.  Admit date: 04/03/2022 Discharge date: 04/04/2022  Admission Diagnoses:  Status post total hip replacement, left [Z96.642] Left hip degenerative joint disease   Discharge Diagnoses: Patient Active Problem List   Diagnosis Date Noted   Status post total hip replacement, left 04/03/2022    Past Medical History:  Diagnosis Date   Chicken pox    COVID 12/2021   DDD (degenerative disc disease), cervical    EBV infection 01/2022   GERD (gastroesophageal reflux disease)    Hypercholesterolemia    Hypertension      Transfusion: None.   Consultants (if any):   Discharged Condition: Improved  Hospital Course: Lynn Finley is an 67 y.o. female who was admitted 04/03/2022 with a diagnosis of left hip degenerative joint disease and went to the operating room on 04/03/2022 and underwent the above named procedures.    Surgeries: Procedure(s): TOTAL HIP ARTHROPLASTY on 04/03/2022 Patient tolerated the surgery well. Taken to PACU where she was stabilized and then transferred to the orthopedic floor.  Started on Eliquis 2.5mg  ever 12 hrs. Heels elevated on bed with rolled towels. No evidence of DVT. Negative Homan. Physical therapy started on day #0 for gait training and transfer. OT started day #1 for ADL and assisted devices.  Initially after surgery patient had increased weakness and numbness in her left quad from intra-operative medication injected affecting the femoral nerve.  Strength has much improved this morning, sensation continues to improve as well.  Patient's IV was removed on POD1.  Implants: Biomet press-fit system with a #11 laterally offset Echo femoral stem, a 48 mm acetabular shell with an E-poly hi-wall liner, and a 32 mm ceramic head with a -6 mm neck.   She was given perioperative antibiotics:  Anti-infectives (From admission, onward)    Start     Dose/Rate  Route Frequency Ordered Stop   04/03/22 1845  ceFAZolin (ANCEF) IVPB 2g/100 mL premix  Status:  Discontinued        2 g 200 mL/hr over 30 Minutes Intravenous Every 6 hours 04/03/22 1750 04/03/22 1753   04/03/22 1330  ceFAZolin (ANCEF) IVPB 2g/100 mL premix        2 g 200 mL/hr over 30 Minutes Intravenous Every 6 hours 04/03/22 1007 04/04/22 0257   04/03/22 1306  ceFAZolin (ANCEF) 2-4 GM/100ML-% IVPB       Note to Pharmacy: Laveda Norman V: cabinet override      04/03/22 1306 04/04/22 0114   04/03/22 0618  ceFAZolin (ANCEF) 2-4 GM/100ML-% IVPB       Note to Pharmacy: Jeanene Erb E: cabinet override      04/03/22 0618 04/03/22 0740   04/03/22 0600  ceFAZolin (ANCEF) IVPB 2g/100 mL premix        2 g 200 mL/hr over 30 Minutes Intravenous On call to O.R. 04/03/22 0126 04/03/22 0740     .  She was given sequential compression devices, early ambulation, and Eliquis for DVT prophylaxis.  She benefited maximally from the hospital stay and there were no complications.    Recent vital signs:  Vitals:   04/04/22 0804 04/04/22 1200  BP: 106/61 105/60  Pulse: 65 (!) 59  Resp: 16 16  Temp: 97.6 F (36.4 C) 98.6 F (37 C)  SpO2: 98% 98%    Recent laboratory studies:  Lab Results  Component Value Date   HGB 9.5 (L) 04/04/2022   Lab Results  Component Value Date  WBC 9.3 04/04/2022   PLT 217 04/04/2022   No results found for: "INR" No results found for: "NA", "K", "CL", "CO2", "BUN", "CREATININE", "GLUCOSE"  Discharge Medications:   Allergies as of 04/04/2022   No Known Allergies      Medication List     STOP taking these medications    naproxen sodium 220 MG tablet Commonly known as: ALEVE   pravastatin 10 MG tablet Commonly known as: PRAVACHOL       TAKE these medications    apixaban 2.5 MG Tabs tablet Commonly known as: Eliquis Take 1 tablet (2.5 mg total) by mouth 2 (two) times daily.   bisoprolol-hydrochlorothiazide 5-6.25 MG tablet Commonly known  as: ZIAC Take 1 tablet by mouth at bedtime.   diphenhydrAMINE HCl (Sleep) 50 MG Caps Take 1 capsule by mouth at bedtime.   docusate sodium 50 MG capsule Commonly known as: COLACE Take 50 mg by mouth 2 (two) times daily.   gabapentin 300 MG capsule Commonly known as: NEURONTIN Take 300 mg by mouth at bedtime.   oxyCODONE 5 MG immediate release tablet Commonly known as: Roxicodone Take 1-2 tablets (5-10 mg total) by mouth every 4 (four) hours as needed for moderate pain or severe pain.   polyethylene glycol 17 g packet Commonly known as: MIRALAX / GLYCOLAX Take 17 g by mouth daily as needed.   tizanidine 2 MG capsule Commonly known as: ZANAFLEX Take 2 mg by mouth 3 (three) times daily as needed for muscle spasms.   vitamin B-12 500 MCG tablet Commonly known as: CYANOCOBALAMIN Take 500 mcg by mouth daily.   Vitamin D3 50 MCG (2000 UT) Tabs Generic drug: Cholecalciferol Take 1 tablet by mouth daily.               Durable Medical Equipment  (From admission, onward)           Start     Ordered   04/03/22 1751  DME Bedside commode  Once       Question:  Patient needs a bedside commode to treat with the following condition  Answer:  Status post total hip replacement, left   04/03/22 1750   04/03/22 1751  DME 3 n 1  Once        04/03/22 1750   04/03/22 1751  DME Walker rolling  Once       Question Answer Comment  Walker: With 5 Inch Wheels   Patient needs a walker to treat with the following condition Status post total hip replacement, left      04/03/22 1750            Diagnostic Studies: DG HIP UNILAT W OR W/O PELVIS 2-3 VIEWS LEFT  Result Date: 04/03/2022 CLINICAL DATA:  Status post total left hip arthroplasty. EXAM: DG HIP (WITH OR WITHOUT PELVIS) 2-3V LEFT COMPARISON:  CT abdomen and pelvis 02/14/2022 FINDINGS: There is diffuse decreased bone mineralization. Interval total left hip arthroplasty. No perihardware lucency is seen to indicate hardware  failure or loosening. There posterior left hip surgical skin staples. Expected postoperative intra-articular and subcutaneous air about the left hip. Severe superior right femoroacetabular joint space narrowing, subchondral sclerosis, and subchondral cystic changes. Mild superior pubic symphysis joint space narrowing and subchondral sclerosis. Vascular phleboliths overlie the pelvis. No acute fracture or dislocation. IMPRESSION: Interval total left hip arthroplasty without evidence of hardware failure. Electronically Signed   By: Yvonne Kendall M.D.   On: 04/03/2022 10:39    Disposition: Discharge disposition: 01-Home or  Self Care          Follow-up Information     Poggi, Marshall Cork, MD Follow up.   Specialty: Orthopedic Surgery Why: Follow up Tuesday 4/2 at 9:15 am with Vance Peper, PA Contact information: [place] [address] [phone]                Signed: Judson Roch PA-C 04/04/2022, 1:37 PM

## 2022-04-04 NOTE — Progress Notes (Signed)
Occupational Therapy Treatment Patient Details Name: Lynn Finley MRN: [id number] DOB: [birth year] Today's Date: 04/04/2022   History of present illness Lynn Finley is a 32yoF who comes to [facility] for elective Left THA, posterolateral approach with Dr. Roland Rack, pt WBAT after procedure.   OT comments  Lynn Finley was seen for OT treatment on this date. Upon arrival to room pt seated in chair, agreeable to tx. Pt requires  MIN cues + reacher don underwear/pants, SUP for standing portion with no AD use. MIN simulated car and tall bed height transfers. MOD I don shirt/bra in standing. Educated on Advertising copywriter, car transfers, and DME/AE recs. Pt making good progress toward goals, will continue to follow POC. Discharge recommendation remains appropriate.     Recommendations for follow up therapy are one component of a multi-disciplinary discharge planning process, led by the attending physician.  Recommendations may be updated based on patient status, additional functional criteria and insurance authorization.    Follow Up Recommendations  No OT follow up     Assistance Recommended at Discharge Intermittent Supervision/Assistance  Patient can return home with the following  A little help with walking and/or transfers;A little help with bathing/dressing/bathroom;Help with stairs or ramp for entrance   Equipment Recommendations  BSC/3in1    Recommendations for Other Services      Precautions / Restrictions Precautions Precautions: Fall;Posterior Hip Restrictions Weight Bearing Restrictions: Yes LLE Weight Bearing: Weight bearing as tolerated       Mobility Bed Mobility Overal bed mobility: Needs Assistance Bed Mobility: Supine to Sit, Sit to Supine     Supine to sit: Supervision Sit to supine: Supervision   General bed mobility comments: cues    Transfers Overall transfer level: Needs assistance Equipment used: None, Rolling walker (2 wheels) Transfers: Sit to/from Stand,  Bed to chair/wheelchair/BSC Sit to Stand: Supervision     Step pivot transfers: Supervision           Balance Overall balance assessment: Needs assistance Sitting-balance support: Feet supported, No upper extremity supported Sitting balance-Leahy Scale: Normal     Standing balance support: During functional activity, No upper extremity supported Standing balance-Leahy Scale: Good                             ADL either performed or assessed with clinical judgement   ADL Overall ADL's : Needs assistance/impaired                                       General ADL Comments: MIN cues + reacher don underwear/pants, SUP for standing portion with no AD use. MIN simulated car and tall bed height transfers. MOD I don shirt/bra in standing      Cognition Arousal/Alertness: Awake/alert Behavior During Therapy: WFL for tasks assessed/performed Overall Cognitive Status: Within Functional Limits for tasks assessed                                                     Pertinent Vitals/ Pain       Pain Assessment Pain Assessment: No/denies pain   Frequency  Min 2X/week        Progress Toward Goals  OT Goals(current goals can now be found in the care  plan section)  Progress towards OT goals: Progressing toward goals  Acute Rehab OT Goals Patient Stated Goal: to go home OT Goal Formulation: With patient Time For Goal Achievement: 04/17/22 Potential to Achieve Goals: Good ADL Goals Pt Will Perform Grooming: with modified independence;standing Pt Will Perform Lower Body Dressing: with modified independence;sit to/from stand;with adaptive equipment Pt Will Transfer to Toilet: with modified independence;ambulating;regular height toilet  Plan Discharge plan remains appropriate;Frequency remains appropriate    Co-evaluation                 AM-PAC OT "6 Clicks" Daily Activity     Outcome Measure   Help from another  person eating meals?: None Help from another person taking care of personal grooming?: A Little Help from another person toileting, which includes using toliet, bedpan, or urinal?: A Little Help from another person bathing (including washing, rinsing, drying)?: A Lot Help from another person to put on and taking off regular upper body clothing?: A Little Help from another person to put on and taking off regular lower body clothing?: A Lot 6 Click Score: 17    End of Session Equipment Utilized During Treatment: Rolling walker (2 wheels)  OT Visit Diagnosis: Unsteadiness on feet (R26.81);Muscle weakness (generalized) (M62.81)   Activity Tolerance Patient tolerated treatment well   Patient Left in chair;with call bell/phone within reach;with family/visitor present   Nurse Communication Mobility status        Time: [id number] OT Time Calculation (min): 28 min  Charges: OT General Charges $OT Visit: 1 Visit OT Treatments $Self Care/Home Management : 23-37 mins  Lynn Finley, M.S. OTR/L  04/04/22, 10:48 AM  ascom [phone]

## 2022-04-04 NOTE — TOC Benefit Eligibility Note (Signed)
Patient Advocate Encounter  Insurance verification completed.    The patient is currently admitted and upon discharge could be taking Eliquis 2.5 mg.  The current 30 day co-pay is $47.00.   The patient is insured through [employer] Medicare Part D   Rhyland Hinderliter, CPHT Pharmacy Patient Advocate Specialist [facility] Patient Advocate Team Direct Number: [phone]  Fax: [phone]       

## 2022-04-04 NOTE — Progress Notes (Signed)
DISCHARGE NOTE:  Pt given discharge instructions and scripts. Pt verbalized understanding. BSC and extra honeycomb sent with pt. Pt wheeled to the car by staff. Husband providing transportation.

## 2022-04-04 NOTE — Progress Notes (Addendum)
Physical Therapy Treatment Patient Details Name: Lynn Finley MRN: [id number] DOB: Sep 07, 1955 Today's Date: 04/04/2022   History of Present Illness Lynn Finley is a 55yoF who comes to [facility] for elective Left THA, posterolateral approach with Dr. Roland Rack, pt WBAT after procedure. Pt had some postop weakness of left quads on POD which largely resolved by POD1.    PT Comments    Pt in chair on arrival husband at bedside, lunch finished. Pain remains at goal. Pt has been long arc quadding since last seen, but wants to go back to bed to demo other HEP to husband. No physical assist needed for mobility. Transfers, gait, stairs training, and HEP all minGuard assist and use of DME as needed. Educated on use of KI this session, but not used in session. Left knee instability remains apparent when hip is extended in gait, but is controlled with use of leg and/or RW. All questions answered. MD/RN updated on progress/completion of goals.    Recommendations for follow up therapy are one component of a multi-disciplinary discharge planning process, led by the attending physician.  Recommendations may be updated based on patient status, additional functional criteria and insurance authorization.  Follow Up Recommendations  Follow physician's recommendations for discharge plan and follow up therapies     Assistance Recommended at Discharge Intermittent Supervision/Assistance  Patient can return home with the following Two people to help with walking and/or transfers;A little help with bathing/dressing/bathroom;Assistance with cooking/housework;Help with stairs or ramp for entrance;Assist for transportation   Equipment Recommendations       Recommendations for Other Services       Precautions / Restrictions Precautions Precautions: Fall;Posterior Hip Required Braces or Orthoses: Knee Immobilizer - Left Knee Immobilizer - Left: On when out of bed or walking Restrictions Weight Bearing Restrictions:  Yes LLE Weight Bearing: Weight bearing as tolerated     Mobility  Bed Mobility Overal bed mobility: Needs Assistance Bed Mobility: Supine to Sit     Supine to sit: Supervision Sit to supine: Min guard (husband educated on and demonstrates sfe assistance of pt and observation of precautions.)        Transfers Overall transfer level: Needs assistance Equipment used: Rolling walker (2 wheels) Transfers: Sit to/from Stand Sit to Stand: Supervision           General transfer comment: balances upon standing without use of hands    Ambulation/Gait Ambulation/Gait assistance: Supervision, Min guard Gait Distance (Feet): 300 Feet Assistive device: Rolling walker (2 wheels) Gait Pattern/deviations: Step-to pattern       General Gait Details: similar to AM session; attempts to achieve stepthrough gait thwarted by knee instability/buckling tendency with hip in extension position at toe-off   Stairs Stairs: Yes Stairs assistance: Min guard Stair Management: One rail Left, One rail Right, With cane Number of Stairs: 4 General stair comments: 2 instances of partial buckling, cues to achieve TKE prior to FWB and CL swing phase. Cues for up with good down with bad. husband in attendance   Wheelchair Mobility    Modified Rankin (Stroke Patients Only)       Balance                                            Cognition Arousal/Alertness: Awake/alert Behavior During Therapy: WFL for tasks assessed/performed Overall Cognitive Status: Within Functional Limits for tasks assessed  Exercises Total Joint Exercises Towel Squeeze: Both, 10 reps, Supine Short Arc Quad: AROM, Left, 15 reps, Supine, AAROM, Limitations Short Arc Quad Limitations: education on self assist with gait belt Heel Slides: AROM, AAROM, Left, 15 reps, Supine, Limitations Heel Slides Limitations: education on self assist with gait  belt Hip ABduction/ADduction: AROM, AAROM, Left, 15 reps, Supine, Limitations Hip Abduction/Adduction Limitations: education on self assist with gait belt Long Arc Quad: AROM, Left, Seated, 15 reps    General Comments        Pertinent Vitals/Pain Pain Assessment Pain Assessment: No/denies pain    Home Living                          Prior Function            PT Goals (current goals can now be found in the care plan section) Acute Rehab PT Goals Patient Stated Goal: return to home and begin gait retraining PT Goal Formulation: With patient Time For Goal Achievement: 04/17/22 Potential to Achieve Goals: Good Progress towards PT goals: Goals met/education completed, patient discharged from PT    Frequency    BID      PT Plan Current plan remains appropriate    Co-evaluation              AM-PAC PT "6 Clicks" Mobility   Outcome Measure  Help needed turning from your back to your side while in a flat bed without using bedrails?: A Little Help needed moving from lying on your back to sitting on the side of a flat bed without using bedrails?: A Little Help needed moving to and from a bed to a chair (including a wheelchair)?: A Little Help needed standing up from a chair using your arms (e.g., wheelchair or bedside chair)?: A Little Help needed to walk in hospital room?: A Little Help needed climbing 3-5 steps with a railing? : A Little 6 Click Score: 18    End of Session Equipment Utilized During Treatment: Gait belt Activity Tolerance: No increased pain Patient left: in chair;with nursing/sitter in room;with family/visitor present;with call bell/phone within reach;in bed Nurse Communication: Mobility status PT Visit Diagnosis: Difficulty in walking, not elsewhere classified (R26.2);Other abnormalities of gait and mobility (R26.89)     Time: [id number] PT Time Calculation (min) (ACUTE ONLY): 50 min  Charges:  $Gait Training: 8-22  mins $Therapeutic Exercise: 8-22 mins $Therapeutic Activity: 8-22 mins                    1:48 PM, 04/04/22 Etta Grandchild, PT, DPT Physical Therapist - [place] [facility] 04/04/2022, 1:42 PM

## 2022-04-04 NOTE — Plan of Care (Signed)
  Problem: Activity: Goal: Risk for activity intolerance will decrease Outcome: Progressing   Problem: Nutrition: Goal: Adequate nutrition will be maintained Outcome: Progressing   Problem: Pain Managment: Goal: General experience of comfort will improve Outcome: Progressing   Problem: Safety: Goal: Ability to remain free from injury will improve Outcome: Progressing   Problem: Skin Integrity: Goal: Risk for impaired skin integrity will decrease Outcome: Progressing   

## 2022-04-04 NOTE — Progress Notes (Signed)
  Subjective: 1 Day Post-Op Procedure(s) (LRB): TOTAL HIP ARTHROPLASTY (Left) Patient reports pain as mild.   Patient is well, numbness and weakness to the left anterior thigh has improved. Plan is to go Home after hospital stay. Negative for chest pain and shortness of breath Fever: no Gastrointestinal:Negative for nausea and vomiting  Objective: Vital signs in last 24 hours: Temp:  [97 F (36.1 C)-98.2 F (36.8 C)] 97.6 F (36.4 C) (03/20 0804) Pulse Rate:  [49-71] 65 (03/20 0804) Resp:  [5-20] 16 (03/20 0804) BP: (104-131)/(52-96) 106/61 (03/20 0804) SpO2:  [92 %-100 %] 98 % (03/20 0804)  Intake/Output from previous day:  Intake/Output Summary (Last 24 hours) at 04/04/2022 0814 Last data filed at 04/04/2022 0620 Gross per 24 hour  Intake 2698.6 ml  Output 250 ml  Net 2448.6 ml    Intake/Output this shift: No intake/output data recorded.  Labs: Recent Labs    04/04/22 0519  HGB 9.5*   Recent Labs    04/04/22 0519  WBC 9.3  RBC 3.17*  HCT 28.2*  PLT 217   No results for input(s): "NA", "K", "CL", "CO2", "BUN", "CREATININE", "GLUCOSE", "CALCIUM" in the last 72 hours. No results for input(s): "LABPT", "INR" in the last 72 hours.   EXAM General - Patient is Alert, Appropriate, and Oriented Extremity - ABD soft Dorsiflexion/Plantar flexion intact No cellulitis present Compartment soft Patient with decreased sensation over the femoral nerve distribution but she is able to active her anterior compartment/quad muscles. Able to perform a straight leg raise this morning. Dressing/Incision - clean, dry, no drainage noted to the left honeycomb dressing. Motor Function - intact, moving foot and toes well on exam.  Abdomen soft with intact bowels sounds. Negative homans to the left leg.  Past Medical History:  Diagnosis Date   Chicken pox    COVID 12/2021   DDD (degenerative disc disease), cervical    EBV infection 01/2022   GERD (gastroesophageal reflux  disease)    Hypercholesterolemia    Hypertension     Assessment/Plan: 1 Day Post-Op Procedure(s) (LRB): TOTAL HIP ARTHROPLASTY (Left) Principal Problem:   Status post total hip replacement, left  Estimated body mass index is 29.05 kg/m as calculated from the following:   Height as of this encounter: 5\' 6"  (1.676 m).   Weight as of this encounter: 81.6 kg. Advance diet Up with therapy D/C IV fluids when tolerating po intake.  Labs and vitals reviewed this morning. Patient with weakness in the left leg yesterday, likely due to numbing medication injected at the surgical site affecting the femoral nerve.  Improved this morning. Up with therapy today.   If she is able to perform well today with PT plan will be for discharge home.  DVT Prophylaxis - TED hose and Eliquis Weight-Bearing as tolerated to left leg  J. Cameron Proud, PA-C [facility] Orthopaedic Surgery 04/04/2022, 8:14 AM

## 2022-04-05 LAB — SURGICAL PATHOLOGY

## 2022-05-09 ENCOUNTER — Ambulatory Visit: Payer: Medicare Other | Admitting: Dermatology

## 2022-05-09 ENCOUNTER — Encounter: Payer: Self-pay | Admitting: Dermatology

## 2022-05-09 VITALS — BP 145/81 | HR 65

## 2022-05-09 DIAGNOSIS — D692 Other nonthrombocytopenic purpura: Secondary | ICD-10-CM

## 2022-05-09 DIAGNOSIS — L720 Epidermal cyst: Secondary | ICD-10-CM

## 2022-05-09 DIAGNOSIS — Z1283 Encounter for screening for malignant neoplasm of skin: Secondary | ICD-10-CM

## 2022-05-09 DIAGNOSIS — L578 Other skin changes due to chronic exposure to nonionizing radiation: Secondary | ICD-10-CM | POA: Diagnosis not present

## 2022-05-09 DIAGNOSIS — L821 Other seborrheic keratosis: Secondary | ICD-10-CM

## 2022-05-09 DIAGNOSIS — Z808 Family history of malignant neoplasm of other organs or systems: Secondary | ICD-10-CM

## 2022-05-09 DIAGNOSIS — L814 Other melanin hyperpigmentation: Secondary | ICD-10-CM | POA: Diagnosis not present

## 2022-05-09 DIAGNOSIS — D229 Melanocytic nevi, unspecified: Secondary | ICD-10-CM

## 2022-05-09 NOTE — Progress Notes (Unsigned)
   Follow-Up Visit   Subjective  Lynn Finley is a 66 y.o. female who presents for the following: Skin Cancer Screening and Full Body Skin Exam  The patient presents for Total-Body Skin Exam (TBSE) for skin cancer screening and mole check. The patient has spots, moles and lesions to be evaluated, some may be new or changing and the patient has concerns that these could be cancer.    The following portions of the chart were reviewed this encounter and updated as appropriate: medications, allergies, medical history  Review of Systems:  No other skin or systemic complaints except as noted in HPI or Assessment and Plan.  Objective  Well appearing patient in no apparent distress; mood and affect are within normal limits.  A full examination was performed including scalp, head, eyes, ears, nose, lips, neck, chest, axillae, abdomen, back, buttocks, bilateral upper extremities, bilateral lower extremities, hands, feet, fingers, toes, fingernails, and toenails. All findings within normal limits unless otherwise noted below.   Relevant physical exam findings are noted in the Assessment and Plan.    Assessment & Plan   LENTIGINES, SEBORRHEIC KERATOSES, HEMANGIOMAS - Benign normal skin lesions - Benign-appearing - Call for any changes  MELANOCYTIC NEVI - Tan-brown and/or pink-flesh-colored symmetric macules and papules - Benign appearing on exam today - Observation - Call clinic for new or changing moles - Recommend daily use of broad spectrum spf 30+ sunscreen to sun-exposed areas.   ACTINIC DAMAGE - Chronic condition, secondary to cumulative UV/sun exposure - diffuse scaly erythematous macules with underlying dyspigmentation - Recommend daily broad spectrum sunscreen SPF 30+ to sun-exposed areas, reapply every 2 hours as needed.  - Staying in the shade or wearing long sleeves, sun glasses (UVA+UVB protection) and wide brim hats (4-inch brim around the entire circumference of the hat)  are also recommended for sun protection.  - Call for new or changing lesions.  Purpura - Chronic; persistent and recurrent.  Treatable, but not curable. - Violaceous macules and patches - Benign - Related to trauma, age, sun damage and/or use of blood thinners, chronic use of topical and/or oral steroids - Observe - Can use OTC arnica containing moisturizer such as Dermend Bruise Formula if desired or Dermend Fragile Skin formula and arnica gel daily - Call for worsening or other concerns  EPIDERMAL INCLUSION CYST Exam: L lat canthus 0.4 cm white papule  Pt recently had hip replacement, she should discuss with orthopedic physician when it's safe to have punch excision. Could also consider I&D for less aggressive procedure.   Benign-appearing. Exam most consistent with an epidermal inclusion cyst. Discussed that a cyst is a benign growth that can grow over time and sometimes get irritated or inflamed. Recommend observation if it is not bothersome. Discussed option of surgical excision to remove it if it is growing, symptomatic, or other changes noted. Please call for new or changing lesions so they can be evaluated.  FAMILY HISTORY OF SKIN CANCER What type(s):Unsure  Who affected:Half brother   SKIN CANCER SCREENING PERFORMED TODAY.   Return in about 1 year (around 05/09/2023) for TBSE, may schedule for cyst excision L lat canthus .  Maylene Roes, CMA, am acting as scribe for Darden Dates, MD .  Documentation: I have reviewed the above documentation for accuracy and completeness, and I agree with the above.  Darden Dates, MD

## 2022-05-09 NOTE — Patient Instructions (Addendum)
Purpura - Chronic; persistent and recurrent.  Treatable, but not curable. - Violaceous macules and patches - Benign - Related to trauma, age, sun damage and/or use of blood thinners, chronic use of topical and/or oral steroids - Observe - Can use OTC arnica containing moisturizer such as Dermend Bruise Formula if desired or Dermend Fragile Skin formula and arnica gel daily - Call for worsening or other concern  Recommend taking Heliocare sun protection supplement daily in sunny weather for additional sun protection. For maximum protection on the sunniest days, you can take up to 2 capsules of regular Heliocare OR take 1 capsule of Heliocare Ultra. For prolonged exposure (such as a full day in the sun), you can repeat your dose of the supplement 4 hours after your first dose. Heliocare can be purchased at [employer], at some [facility] or at [email or web address].     Due to recent changes in healthcare laws, you may see results of your pathology and/or laboratory studies on MyChart before the doctors have had a chance to review them. We understand that in some cases there may be results that are confusing or concerning to you. Please understand that not all results are received at the same time and often the doctors may need to interpret multiple results in order to provide you with the best plan of care or course of treatment. Therefore, we ask that you please give [place] 2 business days to thoroughly review all your results before contacting the office for clarification. Should we see a critical lab result, you will be contacted sooner.   If You Need Anything After Your Visit  If you have any questions or concerns for your doctor, please call our main line at [phone] and press option 4 to reach your doctor's medical assistant. If no one answers, please leave a voicemail as directed and we will return your call as soon as possible. Messages left after 4 pm will be answered the following  business day.   You may also send [place] a message via MyChart. We typically respond to MyChart messages within 1-2 business days.  For prescription refills, please ask your pharmacy to contact our office. Our fax number is [phone].  If you have an urgent issue when the clinic is closed that cannot wait until the next business day, you can page your doctor at the number below.    Please note that while we do our best to be available for urgent issues outside of office hours, we are not available 24/7.   If you have an urgent issue and are unable to reach [place], you may choose to seek medical care at your doctor's office, retail clinic, urgent care center, or emergency room.  If you have a medical emergency, please immediately call 911 or go to the emergency department.  Pager Numbers  - Dr. Gwen Pounds: [phone]  - Dr. Neale Burly: [phone]  - Dr. Roseanne Reno: [phone]  In the event of inclement weather, please call our main line at [phone] for an update on the status of any delays or closures.  Dermatology Medication Tips: Please keep the boxes that topical medications come in in order to help keep track of the instructions about where and how to use these. Pharmacies typically print the medication instructions only on the boxes and not directly on the medication tubes.   If your medication is too expensive, please contact our office at [phone] option 4 or send [place] a message through MyChart.   We are unable to  tell what your co-pay for medications will be in advance as this is different depending on your insurance coverage. However, we may be able to find a substitute medication at lower cost or fill out paperwork to get insurance to cover a needed medication.   If a prior authorization is required to get your medication covered by your insurance company, please allow [place] 1-2 business days to complete this process.  Drug prices often vary depending on where the prescription is  filled and some pharmacies may offer cheaper prices.  The website www.goodrx.com contains coupons for medications through different pharmacies. The prices here do not account for what the cost may be with help from insurance (it may be cheaper with your insurance), but the website can give you the price if you did not use any insurance.  - You can print the associated coupon and take it with your prescription to the pharmacy.  - You may also stop by our office during regular business hours and pick up a GoodRx coupon card.  - If you need your prescription sent electronically to a different pharmacy, notify our office through [facility] or by phone at [phone] option 4.     Si Usted Necesita Algo Despus de Su Visita  Tambin puede enviarnos un mensaje a travs de Clinical cytogeneticist. Por lo general respondemos a los mensajes de MyChart en el transcurso de 1 a 2 das hbiles.  Para renovar recetas, por favor pida a su farmacia que se ponga en contacto con nuestra oficina. Annie Sable de fax es [place] [phone].  Si tiene un asunto urgente cuando la clnica est cerrada y que no puede esperar hasta el siguiente da hbil, puede llamar/localizar a su doctor(a) al nmero que aparece a continuacin.   Por favor, tenga en cuenta que aunque hacemos todo lo posible para estar disponibles para asuntos urgentes fuera del horario de [place], no estamos disponibles las 24 horas del da, los 7 [address]  [address] de la [place].   Si tiene un problema urgente y no puede comunicarse con nosotros, puede optar por buscar atencin mdica  en el consultorio de su doctor(a), en una clnica privada, en un centro de atencin urgente o en una sala de emergencias.  Si tiene Engineer, drilling, por favor llame inmediatamente al 911 o vaya a la sala de emergencias.  Nmeros de bper  - Dr. Gwen Pounds: [phone]  - Dra. Moye: [phone]  - Dra. Roseanne Reno: [phone]  En caso de inclemencias del [place], por favor llame  a Lacy Duverney principal al [phone] para una actualizacin sobre el [place] de cualquier retraso o cierre.  Consejos para la medicacin en dermatologa: Por favor, guarde las cajas en las que vienen los medicamentos de uso tpico para ayudarle a seguir las instrucciones sobre dnde y cmo usarlos. Las farmacias generalmente imprimen las instrucciones del medicamento slo en las cajas y no directamente en los tubos del [place].   Si su medicamento es muy caro, por favor, pngase en contacto con Rolm Gala llamando al [phone] y presione la opcin 4 o envenos un mensaje a travs de Clinical cytogeneticist.   No podemos decirle cul ser su copago por los medicamentos por adelantado ya que esto es diferente dependiendo de la cobertura de su seguro. Sin embargo, es posible que podamos encontrar un medicamento sustituto a Audiological scientist un formulario para que el seguro cubra el medicamento que se considera necesario.   Si se requiere una autorizacin previa para que su compaa de seguros [place]  su medicamento, por favor permtanos de 1 a 2 das hbiles para completar [address]  Los precios de los medicamentos varan con frecuencia dependiendo del Environmental consultant de dnde se surte la receta y alguna farmacias pueden ofrecer precios ms baratos.  El sitio web www.goodrx.com tiene cupones para medicamentos de Health and safety inspector. Los precios aqu no tienen en cuenta lo que podra costar con la ayuda del seguro (puede ser ms barato con su seguro), pero el sitio web puede darle el precio si no utiliz Tourist information centre manager.  - Puede imprimir el cupn correspondiente y llevarlo con su receta a la farmacia.  - Tambin puede pasar por nuestra oficina durante el horario de atencin regular y Education officer, museum una tarjeta de cupones de GoodRx.  - Si necesita que su receta se enve electrnicamente a una farmacia diferente, informe a nuestra oficina a travs de MyChart de  o por telfono llamando al [phone] y  presione la opcin 4.

## 2022-05-25 ENCOUNTER — Other Ambulatory Visit: Payer: Self-pay | Admitting: Surgery

## 2022-05-30 ENCOUNTER — Ambulatory Visit: Payer: Medicare Other | Admitting: Dermatology

## 2022-05-30 VITALS — BP 131/73

## 2022-05-30 DIAGNOSIS — D492 Neoplasm of unspecified behavior of bone, soft tissue, and skin: Secondary | ICD-10-CM

## 2022-05-30 DIAGNOSIS — L720 Epidermal cyst: Secondary | ICD-10-CM | POA: Diagnosis not present

## 2022-05-30 MED ORDER — MUPIROCIN 2 % EX OINT: 1.0000 | TOPICAL_OINTMENT | Freq: Every day | CUTANEOUS | 0 refills | Status: DC

## 2022-05-30 NOTE — Progress Notes (Signed)
   Follow-Up Visit   Subjective  Lynn Finley is a 67 y.o. female who presents for the following: Excision of cyst at left lateral canthus  The following portions of the chart were reviewed this encounter and updated as appropriate: medications, allergies, medical history  Review of Systems:  No other skin or systemic complaints except as noted in HPI or Assessment and Plan.  Objective  Well appearing patient in no apparent distress; mood and affect are within normal limits.  A focused examination was performed of the following areas: Face  Relevant physical exam findings are noted in the Assessment and Plan.   left lateral canthus 0.4 cm white papule     Assessment & Plan   Neoplasm of skin left lateral canthus  Skin excision  Lesion length (cm):  0.4 Lesion width (cm):  0.4 Total excision diameter (cm):  0.4 Informed consent: discussed and consent obtained   Timeout: patient name, date of birth, surgical site, and procedure verified   Procedure prep:  Patient was prepped and draped in usual sterile fashion Procedure prep: hypochlorous spray. Anesthesia: the lesion was anesthetized in a standard fashion   Anesthetic:  1% lidocaine w/ epinephrine 1-100,000 local infiltration (1.5 cc) Instrument used comment:  15c Hemostasis achieved with: pressure and electrodesiccation    Skin repair Complexity:  Intermediate Final length (cm):  0.6 Informed consent: discussed and consent obtained   Timeout: patient name, date of birth, surgical site, and procedure verified   Procedure prep:  Patient was prepped and draped in usual sterile fashion Prep type:  Chlorhexidine Anesthesia: the lesion was anesthetized in a standard fashion   Anesthetic:  1% lidocaine w/ epinephrine 1-100,000 local infiltration Reason for type of repair: reduce tension to allow closure, reduce the risk of dehiscence, infection, and necrosis, preserve normal anatomy and preserve normal anatomical and  functional relationships   Undermining: edges undermined   Subcutaneous layers (deep stitches):  Suture size:  5-0 Suture type: Vicryl (polyglactin 910)   Stitches:  Buried vertical mattress Fine/surface layer approximation (top stitches):  Suture size:  6-0 Suture type: nylon   Suture removal (days):  7 Hemostasis achieved with: suture, pressure and electrodesiccation Outcome: patient tolerated procedure well with no complications   Post-procedure details: wound care instructions given   Additional details:  Mupirocin and a pressure dressing applied  Specimen 1 - Surgical pathology Differential Diagnosis: r/o Cyst vs Other Two specimens Check Margins: No 0.4 cm white papule 2 specimens     Return in about 1 week (around 06/06/2022) for Suture Removal.  Anise Salvo, RMA, am acting as scribe for Darden Dates, MD .   Documentation: I have reviewed the above documentation for accuracy and completeness, and I agree with the above.  Darden Dates, MD

## 2022-05-30 NOTE — Patient Instructions (Signed)
Wound Care Instructions for After Surgery  On the day following your surgery, you should begin doing daily dressing changes until your sutures are removed: Remove the bandage. Cleanse the wound gently with soap and water.  Make sure you then dry the skin surrounding the wound completely or the tape will not stick to the skin. Do not use cotton balls on the wound. After the wound is clean and dry, apply the ointment (either prescription antibiotic prescribed by your doctor or plain Vaseline if nothing was prescribed) gently with a Q-tip. If you are using a bandaid to cover: Apply a bandaid large enough to cover the entire wound. If you do not have a bandaid large enough to cover the wound OR if you are sensitive to bandaid adhesive: Cut a non-stick pad (such as Telfa) to fit the size of the wound.  Cover the wound with the non-stick pad. If the wound is draining, you may want to add a small amount of gauze on top of the non-stick pad for a little added compression to the area. Use tape to seal the area completely.  For the next 1-2 weeks: Be sure to keep the wound moist with ointment 24/7 to ensure best healing. If you are unable to cover the wound with a bandage to hold the ointment in place, you may need to reapply the ointment several times a day. Do not bend over or lift heavy items to reduce the chance of elevated blood pressure to the wound. Do not participate in particularly strenuous activities.  Below is a list of dressing supplies you might need.  Cotton-tipped applicators - Q-tips Gauze pads (2x2 and/or 4x4) - All[employer]Purpose Sponges New and clean tube of petroleum jelly (Vaseline) OR prescription antibiotic ointment if prescribed Either a bandaid large enough to cover the entire wound OR non-stick dressing material (Telfa) and Tape (Paper or Hypafix)  FOR ADULT SURGERY PATIENTS: If you need something for pain relief, you may take 1 extra strength Tylenol (acetaminophen) and 2  ibuprofen (200 mg) together every 4 hours as needed. (Do not take these medications if you are allergic to them or if you know you cannot take them for any other reason). Typically you may only need pain medication for 1-3 days.   Comments on the Post-Operative Period Slight swelling and redness often appear around the wound. This is normal and will disappear within several days following the surgery. The healing wound will drain a brownish-red-yellow discharge during healing. This is a normal phase of wound healing. As the wound begins to heal, the drainage may increase in amount. Again, this drainage is normal. Notify us if the drainage becomes persistently bloody, excessively swollen, or intensely painful or develops a foul odor or red streaks.  The healing wound will also typically be itchy. This is normal. If you have severe or persistent pain, Notify us if the discomfort is severe or persistent. Avoid alcoholic beverages when taking pain medicine.  In Case of Wound Hemorrhage A wound hemorrhage is when the bandage suddenly becomes soaked with bright red blood and flows profusely. If this happens, sit down or lie down with your head elevated. If the wound has a dressing on it, do not remove the dressing. Apply pressure to the existing gauze. If the wound is not covered, use a gauze pad to apply pressure and continue applying the pressure for 20 minutes without peeking. DO NOT COVER THE WOUND WITH A LARGE TOWEL OR WASH CLOTH. Release your hand from the   wound site but do not remove the dressing. If the bleeding has stopped, gently clean around the wound. Leave the dressing in place for 24 hours if possible. This wait time allows the blood vessels to close off so that you do not spark a new round of bleeding by disrupting the newly clotted blood vessels with an immediate dressing change. If the bleeding does not subside, continue to hold pressure for 40 minutes. If bleeding continues, page your  physician, contact an After Hours clinic or go to the Emergency Room.   Due to recent changes in healthcare laws, you may see results of your pathology and/or laboratory studies on MyChart before the doctors have had a chance to review them. We understand that in some cases there may be results that are confusing or concerning to you. Please understand that not all results are received at the same time and often the doctors may need to interpret multiple results in order to provide you with the best plan of care or course of treatment. Therefore, we ask that you please give us 2 business days to thoroughly review all your results before contacting the office for clarification. Should we see a critical lab result, you will be contacted sooner.   If You Need Anything After Your Visit  If you have any questions or concerns for your doctor, please call our main line at [phone] and press option 4 to reach your doctor's medical assistant. If no one answers, please leave a voicemail as directed and we will return your call as soon as possible. Messages left after 4 pm will be answered the following business day.   You may also send us a message via MyChart. We typically respond to MyChart messages within 1-2 business days.  For prescription refills, please ask your pharmacy to contact our office. Our fax number is [phone].  If you have an urgent issue when the clinic is closed that cannot wait until the next business day, you can page your doctor at the number below.    Please note that while we do our best to be available for urgent issues outside of office hours, we are not available 24/7.   If you have an urgent issue and are unable to reach us, you may choose to seek medical care at your doctor's office, retail clinic, urgent care center, or emergency room.  If you have a medical emergency, please immediately call 911 or go to the emergency department.  Pager Numbers  - Dr. Kowalski:  [phone]  - Dr. Moye: [phone]  - Dr. Stewart: [phone]  In the event of inclement weather, please call our main line at [phone] for an update on the status of any delays or closures.  Dermatology Medication Tips: Please keep the boxes that topical medications come in in order to help keep track of the instructions about where and how to use these. Pharmacies typically print the medication instructions only on the boxes and not directly on the medication tubes.   If your medication is too expensive, please contact our office at [phone] option 4 or send us a message through MyChart.   We are unable to tell what your co-pay for medications will be in advance as this is different depending on your insurance coverage. However, we may be able to find a substitute medication at lower cost or fill out paperwork to get insurance to cover a needed medication.   If a prior authorization is required to get your medication covered   by your insurance company, please allow us 1-2 business days to complete this process.  Drug prices often vary depending on where the prescription is filled and some pharmacies may offer cheaper prices.  The website www.goodrx.com contains coupons for medications through different pharmacies. The prices here do not account for what the cost may be with help from insurance (it may be cheaper with your insurance), but the website can give you the price if you did not use any insurance.  - You can print the associated coupon and take it with your prescription to the pharmacy.  - You may also stop by our office during regular business hours and pick up a GoodRx coupon card.  - If you need your prescription sent electronically to a different pharmacy, notify our office through [place] MyChart or by phone at [phone] option 4.     Si Usted Necesita Algo Despus de Su Visita  Tambin puede enviarnos un mensaje a travs de MyChart. Por lo general  respondemos a los mensajes de MyChart en el transcurso de 1 a 2 das hbiles.  Para renovar recetas, por favor pida a su farmacia que se ponga en contacto con nuestra oficina. Nuestro nmero de fax es el [phone].  Si tiene un asunto urgente cuando la clnica est cerrada y que no puede esperar hasta el siguiente da hbil, puede llamar/localizar a su doctor(a) al nmero que aparece a continuacin.   Por favor, tenga en cuenta que aunque hacemos todo lo posible para estar disponibles para asuntos urgentes fuera del horario de oficina, no estamos disponibles las 24 horas del da, los 7 das de la semana.   Si tiene un problema urgente y no puede comunicarse con nosotros, puede optar por buscar atencin mdica  en el consultorio de su doctor(a), en una clnica privada, en un centro de atencin urgente o en una sala de emergencias.  Si tiene una emergencia mdica, por favor llame inmediatamente al 911 o vaya a la sala de emergencias.  Nmeros de bper  - Dr. Kowalski: [phone]  - Dra. Moye: [phone]  - Dra. Stewart: [phone]  En caso de inclemencias del tiempo, por favor llame a nuestra lnea principal al [phone] para una actualizacin sobre el estado de cualquier retraso o cierre.  Consejos para la medicacin en dermatologa: Por favor, guarde las cajas en las que vienen los medicamentos de uso tpico para ayudarle a seguir las instrucciones sobre dnde y cmo usarlos. Las farmacias generalmente imprimen las instrucciones del medicamento slo en las cajas y no directamente en los tubos del medicamento.   Si su medicamento es muy caro, por favor, pngase en contacto con nuestra oficina llamando al [phone] y presione la opcin 4 o envenos un mensaje a travs de MyChart.   No podemos decirle cul ser su copago por los medicamentos por adelantado ya que esto es diferente dependiendo de la cobertura de su seguro. Sin embargo, es posible que podamos encontrar un  medicamento sustituto a menor costo o llenar un formulario para que el seguro cubra el medicamento que se considera necesario.   Si se requiere una autorizacin previa para que su compaa de seguros cubra su medicamento, por favor permtanos de 1 a 2 das hbiles para completar este proceso.  Los precios de los medicamentos varan con frecuencia dependiendo del lugar de dnde se surte la receta y alguna farmacias pueden ofrecer precios ms baratos.  El sitio web www.goodrx.com tiene cupones para medicamentos de diferentes farmacias. Los precios aqu no   tienen en cuenta lo que podra costar con la ayuda del seguro (puede ser ms barato con su seguro), pero el sitio web puede darle el precio si no utiliz ningn seguro.  - Puede imprimir el cupn correspondiente y llevarlo con su receta a la farmacia.  - Tambin puede pasar por nuestra oficina durante el horario de atencin regular y recoger una tarjeta de cupones de GoodRx.  - Si necesita que su receta se enve electrnicamente a una farmacia diferente, informe a nuestra oficina a travs de MyChart de [place] o por telfono llamando al [phone] y presione la opcin 4.   

## 2022-05-31 ENCOUNTER — Encounter
Admission: RE | Admit: 2022-05-31 | Discharge: 2022-05-31 | Disposition: A | Discharge status: Home / Self Care | Payer: Medicare Other | Source: Ambulatory Visit | Attending: Surgery | Admitting: Surgery

## 2022-05-31 ENCOUNTER — Other Ambulatory Visit: Payer: Self-pay

## 2022-05-31 VITALS — BP 129/72 | HR 59 | Resp 16 | Ht 66.0 in | Wt 179.7 lb

## 2022-05-31 DIAGNOSIS — Z01812 Encounter for preprocedural laboratory examination: Secondary | ICD-10-CM | POA: Diagnosis present

## 2022-05-31 HISTORY — DX: Myoneural disorder, unspecified: G70.9

## 2022-05-31 HISTORY — DX: Anemia, unspecified: D64.9

## 2022-05-31 LAB — SURGICAL PCR SCREEN
MRSA, PCR: NEGATIVE
Staphylococcus aureus: NEGATIVE

## 2022-05-31 LAB — CBC WITH DIFFERENTIAL/PLATELET
Abs Immature Granulocytes: 0.01 10*3/uL (ref 0.00–0.07)
Basophils Absolute: 0.1 10*3/uL (ref 0.0–0.1)
Basophils Relative: 1 %
Eosinophils Absolute: 0.2 10*3/uL (ref 0.0–0.5)
Eosinophils Relative: 3 %
HCT: 37.7 % (ref 36.0–46.0)
Hemoglobin: 12.2 g/dL (ref 12.0–15.0)
Immature Granulocytes: 0 %
Lymphocytes Relative: 42 %
Lymphs Abs: 2 10*3/uL (ref 0.7–4.0)
MCH: 29.5 pg (ref 26.0–34.0)
MCHC: 32.4 g/dL (ref 30.0–36.0)
MCV: 91.1 fL (ref 80.0–100.0)
Monocytes Absolute: 0.4 10*3/uL (ref 0.1–1.0)
Monocytes Relative: 8 %
Neutro Abs: 2.1 10*3/uL (ref 1.7–7.7)
Neutrophils Relative %: 46 %
Platelets: 302 10*3/uL (ref 150–400)
RBC: 4.14 MIL/uL (ref 3.87–5.11)
RDW: 11.9 % (ref 11.5–15.5)
WBC: 4.7 10*3/uL (ref 4.0–10.5)
nRBC: 0 % (ref 0.0–0.2)

## 2022-05-31 LAB — COMPREHENSIVE METABOLIC PANEL
ALT: 12 U/L (ref 0–44)
AST: 18 U/L (ref 15–41)
Albumin: 4.2 g/dL (ref 3.5–5.0)
Alkaline Phosphatase: 57 U/L (ref 38–126)
Anion gap: 10 (ref 5–15)
BUN: 20 mg/dL (ref 8–23)
CO2: 29 mmol/L (ref 22–32)
Calcium: 9.7 mg/dL (ref 8.9–10.3)
Chloride: 100 mmol/L (ref 98–111)
Creatinine, Ser: 0.72 mg/dL (ref 0.44–1.00)
GFR, Estimated: >60 mL/min (ref >60)
Glucose, Bld: 91 mg/dL (ref 70–99)
Potassium: 3.5 mmol/L (ref 3.5–5.1)
Sodium: 139 mmol/L (ref 135–145)
Total Bilirubin: 0.5 mg/dL (ref 0.3–1.2)
Total Protein: 7 g/dL (ref 6.5–8.1)

## 2022-05-31 LAB — TYPE AND SCREEN
ABO/RH(D): O NEG
Antibody Screen: NEGATIVE

## 2022-05-31 LAB — URINALYSIS, ROUTINE W REFLEX MICROSCOPIC
Bilirubin Urine: NEGATIVE
Glucose, UA: NEGATIVE mg/dL
Hgb urine dipstick: NEGATIVE
Ketones, ur: NEGATIVE mg/dL
Leukocytes,Ua: NEGATIVE
Nitrite: NEGATIVE
Protein, ur: NEGATIVE mg/dL
Specific Gravity, Urine: 1.014 (ref 1.005–1.030)
pH: 5 (ref 5.0–8.0)

## 2022-05-31 NOTE — Patient Instructions (Addendum)
Your procedure is scheduled on: 06/12/22 - Tuesday Report to the Registration Desk on the 1st floor of the [facility]. To find out your arrival time, please call [phone] between 1PM - 3PM on: 06/11/22 - Monday If your arrival time is 6:00 am, do not arrive before that time as the [facility] entrance doors do not open until 6:00 am.  REMEMBER: Instructions that are not followed completely may result in serious medical risk, up to and including death; or upon the discretion of your surgeon and anesthesiologist your surgery may need to be rescheduled.  Do not eat food after midnight the night before surgery.  No gum chewing or hard candies.  You may however, drink CLEAR liquids up to 2 hours before you are scheduled to arrive for your surgery. Do not drink anything within 2 hours of your scheduled arrival time.  Clear liquids include: - water  - apple juice without pulp - gatorade (not RED colors) - black coffee or tea (Do NOT add milk or creamers to the coffee or tea) Do NOT drink anything that is not on this list.  In addition, your doctor has ordered for you to drink the provided:  Ensure Pre-Surgery Clear Carbohydrate Drink  Drinking this carbohydrate drink up to two hours before surgery helps to reduce insulin resistance and improve patient outcomes. Please complete drinking 2 hours before scheduled arrival time.  One week prior to surgery: Stop Anti-inflammatories (NSAIDS) such as Advil, Aleve, Ibuprofen, Motrin, Naproxen, Naprosyn and Aspirin based products such as Excedrin, Goody's Powder, BC Powder.  Stop ANY OVER THE COUNTER supplements until after surgery.  You may however, continue to take Tylenol if needed for pain up until the day of surgery.  TAKE ONLY THESE MEDICATIONS THE MORNING OF SURGERY WITH A SIP OF WATER: NONE   No Alcohol for 24 hours before or after surgery.  No Smoking including e-cigarettes for 24 hours before surgery.  No chewable tobacco  products for at least 6 hours before surgery.  No nicotine patches on the day of surgery.  Do not use any "recreational" drugs for at least a week (preferably 2 weeks) before your surgery.  Please be advised that the combination of cocaine and anesthesia may have negative outcomes, up to and including death. If you test positive for cocaine, your surgery will be cancelled.  On the morning of surgery brush your teeth with toothpaste and water, you may rinse your mouth with mouthwash if you wish. Do not swallow any toothpaste or mouthwash.  Use CHG Soap or wipes as directed on instruction sheet.  Do not wear jewelry, make-up, hairpins, clips or nail polish.  Do not wear lotions, powders, or perfumes.   Do not shave body hair from the neck down 48 hours before surgery.  Contact lenses, hearing aids and dentures may not be worn into surgery.  Do not bring valuables to the hospital. [facility] is not responsible for any missing/lost belongings or valuables.   Notify your doctor if there is any change in your medical condition (cold, fever, infection).  Wear comfortable clothing (specific to your surgery type) to the hospital.  After surgery, you can help prevent lung complications by doing breathing exercises.  Take deep breaths and cough every 1-2 hours. Your doctor may order a device called an Incentive Spirometer to help you take deep breaths. When coughing or sneezing, hold a pillow firmly against your incision with both hands. This is called "splinting." Doing this helps protect your  incision. It also decreases belly discomfort.  If you are being admitted to the hospital overnight, leave your suitcase in the car. After surgery it may be brought to your room.  In case of increased patient census, it may be necessary for you, the patient, to continue your postoperative care in the Same Day Surgery department.  If you are being discharged the day of surgery, you will not be allowed to  drive home. You will need a responsible individual to drive you home and stay with you for 24 hours after surgery.   If you are taking public transportation, you will need to have a responsible individual with you.  Please call the Pre-admissions Testing Dept. at [phone] if you have any questions about these instructions.  Surgery Visitation Policy:  Patients having surgery or a procedure may have two visitors.  Children under the age of 29 must have an adult with them who is not the patient.  Inpatient Visitation:    Visiting hours are 7 a.m. to 8 p.m. Up to four visitors are allowed at one time in a patient room. The visitors may rotate out with other people during the day.  One visitor age 77 or older may stay with the patient overnight and must be in the room by 8 p.m.   Pre-operative 5 CHG Bath Instructions   You can play a key role in reducing the risk of infection after surgery. Your skin needs to be as free of germs as possible. You can reduce the number of germs on your skin by washing with CHG (chlorhexidine gluconate) soap before surgery. CHG is an antiseptic soap that kills germs and continues to kill germs even after washing.   DO NOT use if you have an allergy to chlorhexidine/CHG or antibacterial soaps. If your skin becomes reddened or irritated, stop using the CHG and notify one of our RNs at [phone].   Please shower with the CHG soap starting 4 days before surgery using the following schedule:     Please keep in mind the following:  DO NOT shave, including legs and underarms, starting the day of your first shower.   You may shave your face at any point before/day of surgery.  Place clean sheets on your bed the day you start using CHG soap. Use a clean washcloth (not used since being washed) for each shower. DO NOT sleep with pets once you start using the CHG.   CHG Shower Instructions:  If you choose to wash your hair and private area, wash first with  your normal shampoo/soap.  After you use shampoo/soap, rinse your hair and body thoroughly to remove shampoo/soap residue.  Turn the water OFF and apply about 3 tablespoons (45 ml) of CHG soap to a CLEAN washcloth.  Apply CHG soap ONLY FROM YOUR NECK DOWN TO YOUR TOES (washing for 3-5 minutes)  DO NOT use CHG soap on face, private areas, open wounds, or sores.  Pay special attention to the area where your surgery is being performed.  If you are having back surgery, having someone wash your back for you may be helpful. Wait 2 minutes after CHG soap is applied, then you may rinse off the CHG soap.  Pat dry with a clean towel  Put on clean clothes/pajamas   If you choose to wear lotion, please use ONLY the CHG-compatible lotions on the back of this paper.     Additional instructions for the day of surgery: DO NOT APPLY any lotions,  deodorants, cologne, or perfumes.   Put on clean/comfortable clothes.  Brush your teeth.  Ask your nurse before applying any prescription medications to the skin.      CHG Compatible Lotions   Aveeno Moisturizing lotion  Cetaphil Moisturizing Cream  Cetaphil Moisturizing Lotion  Clairol Herbal Essence Moisturizing Lotion, Dry Skin  Clairol Herbal Essence Moisturizing Lotion, Extra Dry Skin  Clairol Herbal Essence Moisturizing Lotion, Normal Skin  Curel Age Defying Therapeutic Moisturizing Lotion with Alpha Hydroxy  Curel Extreme Care Body Lotion  Curel Soothing Hands Moisturizing Hand Lotion  Curel Therapeutic Moisturizing Cream, Fragrance-Free  Curel Therapeutic Moisturizing Lotion, Fragrance-Free  Curel Therapeutic Moisturizing Lotion, Original Formula  Eucerin Daily Replenishing Lotion  Eucerin Dry Skin Therapy Plus Alpha Hydroxy Crme  Eucerin Dry Skin Therapy Plus Alpha Hydroxy Lotion  Eucerin Original Crme  Eucerin Original Lotion  Eucerin Plus Crme Eucerin Plus Lotion  Eucerin TriLipid Replenishing Lotion  Keri Anti-Bacterial Hand Lotion   Keri Deep Conditioning Original Lotion Dry Skin Formula Softly Scented  Keri Deep Conditioning Original Lotion, Fragrance Free Sensitive Skin Formula  Keri Lotion Fast Absorbing Fragrance Free Sensitive Skin Formula  Keri Lotion Fast Absorbing Softly Scented Dry Skin Formula  Keri Original Lotion  Keri Skin Renewal Lotion Keri Silky Smooth Lotion  Keri Silky Smooth Sensitive Skin Lotion  Nivea Body Creamy Conditioning Oil  Nivea Body Extra Enriched Lotion  Nivea Body Original Lotion  Nivea Body Sheer Moisturizing Lotion Nivea Crme  Nivea Skin Firming Lotion  NutraDerm 30 Skin Lotion  NutraDerm Skin Lotion  NutraDerm Therapeutic Skin Cream  NutraDerm Therapeutic Skin Lotion  ProShield Protective Hand Cream  Provon moisturizing lotion  How to Use an Incentive Spirometer  An incentive spirometer is a tool that measures how well you are filling your lungs with each breath. Learning to take long, deep breaths using this tool can help you keep your lungs clear and active. This may help to reverse or lessen your chance of developing breathing (pulmonary) problems, especially infection. You may be asked to use a spirometer: After a surgery. If you have a lung problem or a history of smoking. After a long period of time when you have been unable to move or be active. If the spirometer includes an indicator to show the highest number that you have reached, your health care provider or respiratory therapist will help you set a goal. Keep a log of your progress as told by your health care provider. What are the risks? Breathing too quickly may cause dizziness or cause you to pass out. Take your time so you do not get dizzy or light-headed. If you are in pain, you may need to take pain medicine before doing incentive spirometry. It is harder to take a deep breath if you are having pain. How to use your incentive spirometer  Sit up on the edge of your bed or on a chair. Hold the incentive  spirometer so that it is in an upright position. Before you use the spirometer, breathe out normally. Place the mouthpiece in your mouth. Make sure your lips are closed tightly around it. Breathe in slowly and as deeply as you can through your mouth, causing the piston or the ball to rise toward the top of the chamber. Hold your breath for 3-5 seconds, or for as long as possible. If the spirometer includes a coach indicator, use this to guide you in breathing. Slow down your breathing if the indicator goes above the marked areas. Remove the mouthpiece  from your mouth and breathe out normally. The piston or ball will return to the bottom of the chamber. Rest for a few seconds, then repeat the steps 10 or more times. Take your time and take a few normal breaths between deep breaths so that you do not get dizzy or light-headed. Do this every 1-2 hours when you are awake. If the spirometer includes a goal marker to show the highest number you have reached (best effort), use this as a goal to work toward during each repetition. After each set of 10 deep breaths, cough a few times. This will help to make sure that your lungs are clear. If you have an incision on your chest or abdomen from surgery, place a pillow or a rolled-up towel firmly against the incision when you cough. This can help to reduce pain while taking deep breaths and coughing. General tips When you are able to get out of bed: Walk around often. Continue to take deep breaths and cough in order to clear your lungs. Keep using the incentive spirometer until your health care provider says it is okay to stop using it. If you have been in the hospital, you may be told to keep using the spirometer at home. Contact a health care provider if: You are having difficulty using the spirometer. You have trouble using the spirometer as often as instructed. Your pain medicine is not giving enough relief for you to use the spirometer as told. You  have a fever. Get help right away if: You develop shortness of breath. You develop a cough with bloody mucus from the lungs. You have fluid or blood coming from an incision site after you cough. Summary An incentive spirometer is a tool that can help you learn to take long, deep breaths to keep your lungs clear and active. You may be asked to use a spirometer after a surgery, if you have a lung problem or a history of smoking, or if you have been inactive for a long period of time. Use your incentive spirometer as instructed every 1-2 hours while you are awake. If you have an incision on your chest or abdomen, place a pillow or a rolled-up towel firmly against your incision when you cough. This will help to reduce pain. Get help right away if you have shortness of breath, you cough up bloody mucus, or blood comes from your incision when you cough. This information is not intended to replace advice given to you by your health care provider. Make sure you discuss any questions you have with your health care provider. Document Revised: 03/23/2019 Document Reviewed: 03/23/2019 Elsevier Patient Education  2023 [employer].

## 2022-06-05 NOTE — Progress Notes (Signed)
This patient is scheduled to undergo a total hip arthroplasty.  This surgery often can result in substantial blood loss.  Therefore, it is medically necessary that the preoperative H&H is known as a reference.  In addition, it is important to know her platelet count to be sure that she has adequate platelets for clotting purposes.  Thank you!

## 2022-06-06 ENCOUNTER — Ambulatory Visit: Payer: Medicare Other

## 2022-06-06 NOTE — Progress Notes (Unsigned)
   Follow-Up Visit   Subjective  Lynn Finley is a 67 y.o. female who presents for the following: Suture removal - L lat canthus  Pathology showed a benign cyst.  The following portions of the chart were reviewed this encounter and updated as appropriate: medications, allergies, medical history  Review of Systems:  No other skin or systemic complaints except as noted in HPI or Assessment and Plan.  Objective   Well appearing patient in no apparent distress; mood and affect are within normal limits.  Areas Examined: The face  Relevant physical exam findings are noted in the Assessment and Plan.    Assessment & Plan    Encounter for Removal of Sutures - Incision site is clean, dry and intact. - Wound cleansed, sutures removed, wound cleansed and steri strips applied.  - Discussed pathology results showing a benign cyst. - Patient advised to keep steri-strips dry until they fall off. - Scars remodel for a full year. - Once steri-strips fall off, patient can apply over-the-counter silicone scar cream once to twice a day to help with scar remodeling if desired. - Patient advised to call with any concerns or if they notice any new or changing lesions.  Return for appointment as scheduled.  Mickle Mallory CMA, AAMA

## 2022-06-06 NOTE — Patient Instructions (Signed)
Due to recent changes in healthcare laws, you may see results of your pathology and/or laboratory studies on MyChart before the doctors have had a chance to review them. We understand that in some cases there may be results that are confusing or concerning to you. Please understand that not all results are received at the same time and often the doctors may need to interpret multiple results in order to provide you with the best plan of care or course of treatment. Therefore, we ask that you please give us 2 business days to thoroughly review all your results before contacting the office for clarification. Should we see a critical lab result, you will be contacted sooner.   If You Need Anything After Your Visit  If you have any questions or concerns for your doctor, please call our main line at [phone] and press option 4 to reach your doctor's medical assistant. If no one answers, please leave a voicemail as directed and we will return your call as soon as possible. Messages left after 4 pm will be answered the following business day.   You may also send us a message via MyChart. We typically respond to MyChart messages within 1-2 business days.  For prescription refills, please ask your pharmacy to contact our office. Our fax number is [phone].  If you have an urgent issue when the clinic is closed that cannot wait until the next business day, you can page your doctor at the number below.    Please note that while we do our best to be available for urgent issues outside of office hours, we are not available 24/7.   If you have an urgent issue and are unable to reach us, you may choose to seek medical care at your doctor's office, retail clinic, urgent care center, or emergency room.  If you have a medical emergency, please immediately call 911 or go to the emergency department.  Pager Numbers  - Dr. Kowalski: [phone]  - Dr. Moye: [phone]  - Dr. Stewart:  [phone]  In the event of inclement weather, please call our main line at [phone] for an update on the status of any delays or closures.  Dermatology Medication Tips: Please keep the boxes that topical medications come in in order to help keep track of the instructions about where and how to use these. Pharmacies typically print the medication instructions only on the boxes and not directly on the medication tubes.   If your medication is too expensive, please contact our office at [phone] option 4 or send us a message through MyChart.   We are unable to tell what your co-pay for medications will be in advance as this is different depending on your insurance coverage. However, we may be able to find a substitute medication at lower cost or fill out paperwork to get insurance to cover a needed medication.   If a prior authorization is required to get your medication covered by your insurance company, please allow us 1-2 business days to complete this process.  Drug prices often vary depending on where the prescription is filled and some pharmacies may offer cheaper prices.  The website www.goodrx.com contains coupons for medications through different pharmacies. The prices here do not account for what the cost may be with help from insurance (it may be cheaper with your insurance), but the website can give you the price if you did not use any insurance.  - You can print the associated coupon and take it with   your prescription to the pharmacy.  - You may also stop by our office during regular business hours and pick up a GoodRx coupon card.  - If you need your prescription sent electronically to a different pharmacy, notify our office through [facility] MyChart or by phone at [phone] option 4.     Si Usted Necesita Algo Despus de Su Visita  Tambin puede enviarnos un mensaje a travs de MyChart. Por lo general respondemos a los mensajes de MyChart en el transcurso de 1 a 2  das hbiles.  Para renovar recetas, por favor pida a su farmacia que se ponga en contacto con nuestra oficina. Nuestro nmero de fax es el [phone].  Si tiene un asunto urgente cuando la clnica est cerrada y que no puede esperar hasta el siguiente da hbil, puede llamar/localizar a su doctor(a) al nmero que aparece a continuacin.   Por favor, tenga en cuenta que aunque hacemos todo lo posible para estar disponibles para asuntos urgentes fuera del horario de oficina, no estamos disponibles las 24 horas del da, los 7 das de la semana.   Si tiene un problema urgente y no puede comunicarse con nosotros, puede optar por buscar atencin mdica  en el consultorio de su doctor(a), en una clnica privada, en un centro de atencin urgente o en una sala de emergencias.  Si tiene una emergencia mdica, por favor llame inmediatamente al 911 o vaya a la sala de emergencias.  Nmeros de bper  - Dr. Kowalski: [phone]  - Dra. Moye: [phone]  - Dra. Stewart: [phone]  En caso de inclemencias del tiempo, por favor llame a nuestra lnea principal al [phone] para una actualizacin sobre el estado de cualquier retraso o cierre.  Consejos para la medicacin en dermatologa: Por favor, guarde las cajas en las que vienen los medicamentos de uso tpico para ayudarle a seguir las instrucciones sobre dnde y cmo usarlos. Las farmacias generalmente imprimen las instrucciones del medicamento slo en las cajas y no directamente en los tubos del medicamento.   Si su medicamento es muy caro, por favor, pngase en contacto con nuestra oficina llamando al [phone] y presione la opcin 4 o envenos un mensaje a travs de MyChart.   No podemos decirle cul ser su copago por los medicamentos por adelantado ya que esto es diferente dependiendo de la cobertura de su seguro. Sin embargo, es posible que podamos encontrar un medicamento sustituto a menor costo o llenar un formulario para que el  seguro cubra el medicamento que se considera necesario.   Si se requiere una autorizacin previa para que su compaa de seguros cubra su medicamento, por favor permtanos de 1 a 2 das hbiles para completar este proceso.  Los precios de los medicamentos varan con frecuencia dependiendo del lugar de dnde se surte la receta y alguna farmacias pueden ofrecer precios ms baratos.  El sitio web www.goodrx.com tiene cupones para medicamentos de diferentes farmacias. Los precios aqu no tienen en cuenta lo que podra costar con la ayuda del seguro (puede ser ms barato con su seguro), pero el sitio web puede darle el precio si no utiliz ningn seguro.  - Puede imprimir el cupn correspondiente y llevarlo con su receta a la farmacia.  - Tambin puede pasar por nuestra oficina durante el horario de atencin regular y recoger una tarjeta de cupones de GoodRx.  - Si necesita que su receta se enve electrnicamente a una farmacia diferente, informe a nuestra oficina a travs de MyChart de [facility]   o por telfono llamando al [phone] y presione la opcin 4.  

## 2022-06-12 ENCOUNTER — Ambulatory Visit: Payer: Medicare Other

## 2022-06-12 ENCOUNTER — Other Ambulatory Visit: Payer: Self-pay

## 2022-06-12 ENCOUNTER — Ambulatory Visit
Admission: RE | Admit: 2022-06-12 | Discharge: 2022-06-12 | Disposition: A | Discharge status: Home / Self Care | Payer: Medicare Other | Attending: Surgery | Admitting: Surgery

## 2022-06-12 ENCOUNTER — Encounter
Admission: RE | Disposition: A | Discharge status: Home / Self Care | Payer: Self-pay | Source: Home / Self Care | Attending: Surgery

## 2022-06-12 ENCOUNTER — Encounter: Payer: Self-pay | Admitting: Surgery

## 2022-06-12 ENCOUNTER — Ambulatory Visit: Payer: Medicare Other | Admitting: Urgent Care

## 2022-06-12 DIAGNOSIS — Z96642 Presence of left artificial hip joint: Secondary | ICD-10-CM | POA: Diagnosis not present

## 2022-06-12 DIAGNOSIS — I10 Essential (primary) hypertension: Secondary | ICD-10-CM | POA: Diagnosis not present

## 2022-06-12 DIAGNOSIS — M1611 Unilateral primary osteoarthritis, right hip: Secondary | ICD-10-CM | POA: Insufficient documentation

## 2022-06-12 DIAGNOSIS — K219 Gastro-esophageal reflux disease without esophagitis: Secondary | ICD-10-CM | POA: Insufficient documentation

## 2022-06-12 DIAGNOSIS — Z79899 Other long term (current) drug therapy: Secondary | ICD-10-CM | POA: Diagnosis not present

## 2022-06-12 HISTORY — PX: TOTAL HIP ARTHROPLASTY: SHX124

## 2022-06-12 SURGERY — ARTHROPLASTY, HIP, TOTAL,POSTERIOR APPROACH
Anesthesia: General | Site: Hip | Laterality: Right

## 2022-06-12 MED ORDER — PHENYLEPHRINE 80 MCG/ML (10ML) SYRINGE FOR IV PUSH (FOR BLOOD PRESSURE SUPPORT)
PREFILLED_SYRINGE | INTRAVENOUS | Status: AC
  Filled 2022-06-12: qty 10

## 2022-06-12 MED ORDER — CEFAZOLIN SODIUM-DEXTROSE 2-4 GM/100ML-% IV SOLN
INTRAVENOUS | Status: AC
  Filled 2022-06-12: qty 100

## 2022-06-12 MED ORDER — EPINEPHRINE PF 1 MG/ML IJ SOLN
INTRAMUSCULAR | Status: AC
  Filled 2022-06-12: qty 1

## 2022-06-12 MED ORDER — CHLORHEXIDINE GLUCONATE 0.12 % MT SOLN
15.0000 mL | Freq: Once | OROMUCOSAL | Status: AC
  Administered 2022-06-12: 15 mL via OROMUCOSAL

## 2022-06-12 MED ORDER — KETOROLAC TROMETHAMINE 15 MG/ML IJ SOLN
15.0000 mg | Freq: Once | INTRAMUSCULAR | Status: AC
  Administered 2022-06-12: 15 mg via INTRAVENOUS

## 2022-06-12 MED ORDER — OXYCODONE HCL 5 MG/5ML PO SOLN: 5.0000 mg | Freq: Once | ORAL | Status: AC | PRN

## 2022-06-12 MED ORDER — LIDOCAINE HCL (CARDIAC) PF 100 MG/5ML IV SOSY
PREFILLED_SYRINGE | INTRAVENOUS | Status: DC | PRN
  Administered 2022-06-12: 80 mg via INTRAVENOUS

## 2022-06-12 MED ORDER — CEFAZOLIN SODIUM-DEXTROSE 2-4 GM/100ML-% IV SOLN
2.0000 g | Freq: Four times a day (QID) | INTRAVENOUS | Status: DC
  Administered 2022-06-12: 2 g via INTRAVENOUS

## 2022-06-12 MED ORDER — PROPOFOL 10 MG/ML IV BOLUS
INTRAVENOUS | Status: DC | PRN
  Administered 2022-06-12: 20 mg via INTRAVENOUS
  Administered 2022-06-12: 150 mg via INTRAVENOUS
  Administered 2022-06-12: 30 mg via INTRAVENOUS

## 2022-06-12 MED ORDER — LACTATED RINGERS IV SOLN: INTRAVENOUS | Status: DC

## 2022-06-12 MED ORDER — METOCLOPRAMIDE HCL 10 MG PO TABS: 5.0000 mg | ORAL_TABLET | Freq: Three times a day (TID) | ORAL | Status: DC | PRN

## 2022-06-12 MED ORDER — TRANEXAMIC ACID 1000 MG/10ML IV SOLN
INTRAVENOUS | Status: DC | PRN
  Administered 2022-06-12: 1000 mg via TOPICAL

## 2022-06-12 MED ORDER — HYDROMORPHONE HCL 1 MG/ML IJ SOLN
INTRAMUSCULAR | Status: AC
  Filled 2022-06-12: qty 1

## 2022-06-12 MED ORDER — BUPIVACAINE LIPOSOME 1.3 % IJ SUSP
INTRAMUSCULAR | Status: AC
  Filled 2022-06-12: qty 20

## 2022-06-12 MED ORDER — PROPOFOL 10 MG/ML IV BOLUS
INTRAVENOUS | Status: AC
  Filled 2022-06-12: qty 20

## 2022-06-12 MED ORDER — FAMOTIDINE 20 MG PO TABS
ORAL_TABLET | ORAL | Status: AC
  Filled 2022-06-12: qty 1

## 2022-06-12 MED ORDER — ACETAMINOPHEN 10 MG/ML IV SOLN
INTRAVENOUS | Status: AC
  Filled 2022-06-12: qty 100

## 2022-06-12 MED ORDER — ACETAMINOPHEN 325 MG PO TABS: 325.0000 mg | ORAL_TABLET | Freq: Four times a day (QID) | ORAL | Status: DC | PRN

## 2022-06-12 MED ORDER — SODIUM CHLORIDE FLUSH 0.9 % IV SOLN
INTRAVENOUS | Status: AC
  Filled 2022-06-12: qty 40

## 2022-06-12 MED ORDER — FENTANYL CITRATE (PF) 100 MCG/2ML IJ SOLN
INTRAMUSCULAR | Status: DC | PRN
  Administered 2022-06-12 (×2): 50 ug via INTRAVENOUS

## 2022-06-12 MED ORDER — FENTANYL CITRATE (PF) 100 MCG/2ML IJ SOLN
INTRAMUSCULAR | Status: AC
  Filled 2022-06-12: qty 2

## 2022-06-12 MED ORDER — APIXABAN 2.5 MG PO TABS: 2.5000 mg | ORAL_TABLET | Freq: Two times a day (BID) | ORAL | 0 refills | Status: AC

## 2022-06-12 MED ORDER — ROCURONIUM BROMIDE 100 MG/10ML IV SOLN
INTRAVENOUS | Status: DC | PRN
  Administered 2022-06-12: 10 mg via INTRAVENOUS
  Administered 2022-06-12: 60 mg via INTRAVENOUS

## 2022-06-12 MED ORDER — ONDANSETRON HCL 4 MG/2ML IJ SOLN
INTRAMUSCULAR | Status: AC
  Filled 2022-06-12: qty 2

## 2022-06-12 MED ORDER — SODIUM CHLORIDE 0.9 % IV SOLN: INTRAVENOUS | Status: DC

## 2022-06-12 MED ORDER — FAMOTIDINE 20 MG PO TABS
20.0000 mg | ORAL_TABLET | Freq: Once | ORAL | Status: AC
  Administered 2022-06-12: 20 mg via ORAL

## 2022-06-12 MED ORDER — KETAMINE HCL 50 MG/5ML IJ SOSY
PREFILLED_SYRINGE | INTRAMUSCULAR | Status: AC
  Filled 2022-06-12: qty 5

## 2022-06-12 MED ORDER — ONDANSETRON HCL 4 MG PO TABS: 4.0000 mg | ORAL_TABLET | Freq: Four times a day (QID) | ORAL | Status: DC | PRN

## 2022-06-12 MED ORDER — APIXABAN 2.5 MG PO TABS: 2.5000 mg | ORAL_TABLET | Freq: Two times a day (BID) | ORAL | 0 refills | Status: DC

## 2022-06-12 MED ORDER — FENTANYL CITRATE (PF) 100 MCG/2ML IJ SOLN
25.0000 ug | INTRAMUSCULAR | Status: DC | PRN
  Administered 2022-06-12 (×4): 25 ug via INTRAVENOUS

## 2022-06-12 MED ORDER — BUPIVACAINE HCL (PF) 0.5 % IJ SOLN
INTRAMUSCULAR | Status: AC
  Filled 2022-06-12: qty 30

## 2022-06-12 MED ORDER — KETAMINE HCL 10 MG/ML IJ SOLN
INTRAMUSCULAR | Status: DC | PRN
  Administered 2022-06-12: 30 mg via INTRAVENOUS

## 2022-06-12 MED ORDER — HYDROMORPHONE HCL 1 MG/ML IJ SOLN
INTRAMUSCULAR | Status: DC | PRN
  Administered 2022-06-12 (×2): .5 mg via INTRAVENOUS

## 2022-06-12 MED ORDER — MIDAZOLAM HCL 2 MG/2ML IJ SOLN
INTRAMUSCULAR | Status: AC
  Filled 2022-06-12: qty 2

## 2022-06-12 MED ORDER — ONDANSETRON HCL 4 MG/2ML IJ SOLN
INTRAMUSCULAR | Status: DC | PRN
  Administered 2022-06-12: 4 mg via INTRAVENOUS

## 2022-06-12 MED ORDER — OXYCODONE HCL 5 MG PO TABS: 5.0000 mg | ORAL_TABLET | ORAL | 0 refills | Status: AC | PRN

## 2022-06-12 MED ORDER — OXYCODONE HCL 5 MG PO TABS
5.0000 mg | ORAL_TABLET | Freq: Once | ORAL | Status: AC | PRN
  Administered 2022-06-12: 5 mg via ORAL

## 2022-06-12 MED ORDER — OXYCODONE HCL 5 MG PO TABS
5.0000 mg | ORAL_TABLET | ORAL | Status: DC | PRN
  Administered 2022-06-12: 5 mg via ORAL

## 2022-06-12 MED ORDER — DEXAMETHASONE SODIUM PHOSPHATE 10 MG/ML IJ SOLN
INTRAMUSCULAR | Status: AC
  Filled 2022-06-12: qty 1

## 2022-06-12 MED ORDER — KETOROLAC TROMETHAMINE 30 MG/ML IJ SOLN
INTRAMUSCULAR | Status: AC
  Filled 2022-06-12: qty 1

## 2022-06-12 MED ORDER — GLYCOPYRROLATE 0.2 MG/ML IJ SOLN
INTRAMUSCULAR | Status: DC | PRN
  Administered 2022-06-12: .2 mg via INTRAVENOUS

## 2022-06-12 MED ORDER — LIDOCAINE HCL (PF) 2 % IJ SOLN
INTRAMUSCULAR | Status: AC
  Filled 2022-06-12: qty 5

## 2022-06-12 MED ORDER — ONDANSETRON HCL 4 MG/2ML IJ SOLN: 4.0000 mg | Freq: Four times a day (QID) | INTRAMUSCULAR | Status: DC | PRN

## 2022-06-12 MED ORDER — MIDAZOLAM HCL 2 MG/2ML IJ SOLN
INTRAMUSCULAR | Status: DC | PRN
  Administered 2022-06-12: 2 mg via INTRAVENOUS

## 2022-06-12 MED ORDER — ORAL CARE MOUTH RINSE: 15.0000 mL | Freq: Once | OROMUCOSAL | Status: AC

## 2022-06-12 MED ORDER — CEFAZOLIN SODIUM-DEXTROSE 2-4 GM/100ML-% IV SOLN
2.0000 g | INTRAVENOUS | Status: AC
  Administered 2022-06-12: 2 g via INTRAVENOUS

## 2022-06-12 MED ORDER — SUGAMMADEX SODIUM 200 MG/2ML IV SOLN
INTRAVENOUS | Status: DC | PRN
  Administered 2022-06-12: 200 mg via INTRAVENOUS

## 2022-06-12 MED ORDER — CHLORHEXIDINE GLUCONATE 0.12 % MT SOLN
OROMUCOSAL | Status: AC
  Filled 2022-06-12: qty 15

## 2022-06-12 MED ORDER — 0.9 % SODIUM CHLORIDE (POUR BTL) OPTIME
TOPICAL | Status: DC | PRN
  Administered 2022-06-12: 500 mL

## 2022-06-12 MED ORDER — TRIAMCINOLONE ACETONIDE 40 MG/ML IJ SUSP
INTRAMUSCULAR | Status: DC | PRN
  Administered 2022-06-12: 93 mL via PERIARTICULAR

## 2022-06-12 MED ORDER — EPHEDRINE SULFATE (PRESSORS) 50 MG/ML IJ SOLN
INTRAMUSCULAR | Status: DC | PRN
  Administered 2022-06-12: 10 mg via INTRAVENOUS
  Administered 2022-06-12 (×2): 5 mg via INTRAVENOUS

## 2022-06-12 MED ORDER — OXYCODONE HCL 5 MG PO TABS: 5.0000 mg | ORAL_TABLET | ORAL | 0 refills | Status: DC | PRN

## 2022-06-12 MED ORDER — METOCLOPRAMIDE HCL 5 MG/ML IJ SOLN: 5.0000 mg | Freq: Three times a day (TID) | INTRAMUSCULAR | Status: DC | PRN

## 2022-06-12 MED ORDER — DEXMEDETOMIDINE HCL IN NACL 80 MCG/20ML IV SOLN
INTRAVENOUS | Status: DC | PRN
  Administered 2022-06-12 (×2): 8 ug via INTRAVENOUS

## 2022-06-12 MED ORDER — ROCURONIUM BROMIDE 10 MG/ML (PF) SYRINGE
PREFILLED_SYRINGE | INTRAVENOUS | Status: AC
  Filled 2022-06-12: qty 10

## 2022-06-12 MED ORDER — SODIUM CHLORIDE 0.9 % IR SOLN
Status: DC | PRN
  Administered 2022-06-12: 3000 mL

## 2022-06-12 MED ORDER — DEXAMETHASONE SODIUM PHOSPHATE 10 MG/ML IJ SOLN
INTRAMUSCULAR | Status: DC | PRN
  Administered 2022-06-12: 10 mg via INTRAVENOUS

## 2022-06-12 MED ORDER — GLYCOPYRROLATE 0.2 MG/ML IJ SOLN
INTRAMUSCULAR | Status: AC
  Filled 2022-06-12: qty 1

## 2022-06-12 MED ORDER — EPHEDRINE 5 MG/ML INJ
INTRAVENOUS | Status: AC
  Filled 2022-06-12: qty 5

## 2022-06-12 MED ORDER — TRIAMCINOLONE ACETONIDE 40 MG/ML IJ SUSP
INTRAMUSCULAR | Status: AC
  Filled 2022-06-12: qty 2

## 2022-06-12 MED ORDER — OXYCODONE HCL 5 MG PO TABS
ORAL_TABLET | ORAL | Status: AC
  Filled 2022-06-12: qty 1

## 2022-06-12 MED ORDER — PHENYLEPHRINE HCL (PRESSORS) 10 MG/ML IV SOLN
INTRAVENOUS | Status: DC | PRN
  Administered 2022-06-12: 80 ug via INTRAVENOUS
  Administered 2022-06-12: 40 ug via INTRAVENOUS
  Administered 2022-06-12: 160 ug via INTRAVENOUS
  Administered 2022-06-12: 40 ug via INTRAVENOUS
  Administered 2022-06-12: 80 ug via INTRAVENOUS
  Administered 2022-06-12: 40 ug via INTRAVENOUS
  Administered 2022-06-12: 80 ug via INTRAVENOUS
  Administered 2022-06-12: 40 ug via INTRAVENOUS
  Administered 2022-06-12 (×2): 80 ug via INTRAVENOUS

## 2022-06-12 MED ORDER — ACETAMINOPHEN 10 MG/ML IV SOLN
INTRAVENOUS | Status: DC | PRN
  Administered 2022-06-12: 1000 mg via INTRAVENOUS

## 2022-06-12 MED ORDER — KETOROLAC TROMETHAMINE 15 MG/ML IJ SOLN
INTRAMUSCULAR | Status: AC
  Filled 2022-06-12: qty 1

## 2022-06-12 MED ORDER — TRANEXAMIC ACID 1000 MG/10ML IV SOLN
INTRAVENOUS | Status: AC
  Filled 2022-06-12: qty 10

## 2022-06-12 SURGICAL SUPPLY — 56 items
APL PRP STRL LF DISP 70% ISPRP (MISCELLANEOUS) ×1
BIT DRILL JC 5IN 2.4M 127 24FL (BIT) IMPLANT
BLADE SAGITTAL WIDE XTHICK NO (BLADE) ×1 IMPLANT
BLADE SURG SZ20 CARB STEEL (BLADE) ×1 IMPLANT
CHLORAPREP W/TINT 26 (MISCELLANEOUS) ×1 IMPLANT
DRAPE 3/4 80X56 (DRAPES) ×1 IMPLANT
DRAPE IMP U-DRAPE 54X76 (DRAPES) IMPLANT
DRAPE INCISE IOBAN 66X60 STRL (DRAPES) ×1 IMPLANT
DRAPE SURG 17X11 SM STRL (DRAPES) ×2 IMPLANT
DRSG MEPILEX SACRM 8.7X9.8 (GAUZE/BANDAGES/DRESSINGS) ×1 IMPLANT
DRSG OPSITE POSTOP 4X10 (GAUZE/BANDAGES/DRESSINGS) ×1 IMPLANT
ELECT CAUTERY BLADE 6.4 (BLADE) ×1 IMPLANT
GAUZE 4X4 16PLY ~~LOC~~+RFID DBL (SPONGE) ×1 IMPLANT
GAUZE XEROFORM 1X8 LF (GAUZE/BANDAGES/DRESSINGS) ×1 IMPLANT
GLOVE BIO SURGEON STRL SZ7.5 (GLOVE) ×4 IMPLANT
GLOVE BIO SURGEON STRL SZ8 (GLOVE) ×4 IMPLANT
GLOVE BIOGEL PI IND STRL 8 (GLOVE) ×1 IMPLANT
GLOVE INDICATOR 8.0 STRL GRN (GLOVE) ×1 IMPLANT
GOWN STRL REUS W/ TWL LRG LVL3 (GOWN DISPOSABLE) ×1 IMPLANT
GOWN STRL REUS W/ TWL XL LVL3 (GOWN DISPOSABLE) ×1 IMPLANT
GOWN STRL REUS W/TWL LRG LVL3 (GOWN DISPOSABLE) ×1
GOWN STRL REUS W/TWL XL LVL3 (GOWN DISPOSABLE) ×1
HANDLE YANKAUER SUCT OPEN TIP (MISCELLANEOUS) ×1 IMPLANT
HEAD CERAMIC BIOLOX 36 T1 STD (Head) IMPLANT
HIP SHELL ACETAB 3H 50MM (Hips) ×1 IMPLANT
HOLSTER ELECTROSUGICAL PENCIL (MISCELLANEOUS) ×1 IMPLANT
HOOD PEEL AWAY T7 (MISCELLANEOUS) ×3 IMPLANT
IV NS IRRIG 3000ML ARTHROMATIC (IV SOLUTION) ×1 IMPLANT
KIT TURNOVER KIT A (KITS) ×1 IMPLANT
LINER ACE G7 36 SZ D HIGH WALL (Liner) IMPLANT
MANIFOLD NEPTUNE II (INSTRUMENTS) ×1 IMPLANT
NDL FILTER BLUNT 18X1 1/2 (NEEDLE) ×1 IMPLANT
NDL SAFETY ECLIP 18X1.5 (MISCELLANEOUS) ×2 IMPLANT
NDL SPNL 20GX3.5 QUINCKE YW (NEEDLE) ×1 IMPLANT
NEEDLE FILTER BLUNT 18X1 1/2 (NEEDLE) ×1 IMPLANT
NEEDLE SPNL 20GX3.5 QUINCKE YW (NEEDLE) ×1 IMPLANT
PACK HIP PROSTHESIS (MISCELLANEOUS) ×1 IMPLANT
PENCIL SMOKE EVACUATOR (MISCELLANEOUS) ×1 IMPLANT
PIN STEIN SMOOTH 3/16X9 (Pin) IMPLANT
PIN STEIN SMOOTH 4.8X9 (Pin) ×1 IMPLANT
PULSAVAC PLUS IRRIG FAN TIP (DISPOSABLE) ×1
SHELL ACETAB HIP 3H 50MM (Hips) IMPLANT
SPONGE T-LAP 18X18 ~~LOC~~+RFID (SPONGE) ×5 IMPLANT
STAPLER SKIN PROX 35W (STAPLE) ×1 IMPLANT
STEM FEM COLLARLESS 10X130X130 (Stem) IMPLANT
SUT TICRON 2-0 30IN 311381 (SUTURE) ×3 IMPLANT
SUT VIC AB 0 CT1 36 (SUTURE) ×1 IMPLANT
SUT VIC AB 1 CT1 36 (SUTURE) ×1 IMPLANT
SUT VIC AB 2-0 CT1 (SUTURE) ×3 IMPLANT
SYR 10ML LL (SYRINGE) ×1 IMPLANT
SYR 20ML LL LF (SYRINGE) ×1 IMPLANT
SYR 30ML LL (SYRINGE) ×2 IMPLANT
TIP FAN IRRIG PULSAVAC PLUS (DISPOSABLE) ×1 IMPLANT
TRAP FLUID SMOKE EVACUATOR (MISCELLANEOUS) ×2 IMPLANT
WATER STERILE IRR 1000ML POUR (IV SOLUTION) ×1 IMPLANT
WATER STERILE IRR 500ML POUR (IV SOLUTION) ×1 IMPLANT

## 2022-06-12 NOTE — Discharge Instructions (Addendum)
Orthopedic discharge instructions: May shower with intact OpSite dressing. Apply ice frequently to knee or use Polar Care. Start Eliquis 1 tablet (2.5 mg) twice daily on Wednesday, 06/13/2022, for 2 weeks, then take aspirin 325 mg twice daily for 4 weeks. Take pain medication as prescribed when needed.  May supplement with ES Tylenol if necessary. May weight-bear as tolerated on right leg - use walker for balance and support. Follow-up in 10-14 days or as scheduled.  AMBULATORY SURGERY  DISCHARGE INSTRUCTIONS   The drugs that you were given will stay in your system until tomorrow so for the next 24 hours you should not:  Drive an automobile Make any legal decisions Drink any alcoholic beverage   You may resume regular meals tomorrow.  Today it is better to start with liquids and gradually work up to solid foods.  You may eat anything you prefer, but it is better to start with liquids, then soup and crackers, and gradually work up to solid foods.   Please notify your doctor immediately if you have any unusual bleeding, trouble breathing, redness and pain at the surgery site, drainage, fever, or pain not relieved by medication.    Additional Instructions: PLEASE LEAVE EXPAREL (TEAL) ARMBAND ON FOR 4 DAYS   Please contact your physician with any problems or Same Day Surgery at [phone], Monday through Friday 6 am to 4 pm, or [place] at [facility] number at [phone].  Information for Discharge Teaching: EXPAREL (bupivacaine liposome injectable suspension)   Your surgeon or anesthesiologist gave you EXPAREL(bupivacaine) to help control your pain after surgery.  EXPAREL is a local anesthetic that provides pain relief by numbing the tissue around the surgical site. EXPAREL is designed to release pain medication over time and can control pain for up to 72 hours. Depending on how you respond to EXPAREL, you may require less pain medication during your  recovery.  Possible side effects: Temporary loss of sensation or ability to move in the area where bupivacaine was injected. Nausea, vomiting, constipation Rarely, numbness and tingling in your mouth or lips, lightheadedness, or anxiety may occur. Call your doctor right away if you think you may be experiencing any of these sensations, or if you have other questions regarding possible side effects.  Follow all other discharge instructions given to you by your surgeon or nurse. Eat a healthy diet and drink plenty of water or other fluids.  If you return to the hospital for any reason within 96 hours following the administration of EXPAREL, it is important for health care providers to know that you have received this anesthetic. A teal colored band has been placed on your arm with the date, time and amount of EXPAREL you have received in order to alert and inform your health care providers. Please leave this armband in place for the full 96 hours following administration, and then you may remove the band.

## 2022-06-12 NOTE — Anesthesia Postprocedure Evaluation (Signed)
Anesthesia Post Note  Patient: Lynn Finley  Procedure(s) Performed: TOTAL HIP ARTHROPLASTY (Right: Hip)  Patient location during evaluation: PACU Anesthesia Type: General Level of consciousness: awake and alert, oriented and patient cooperative Pain management: pain level controlled Vital Signs Assessment: post-procedure vital signs reviewed and stable Respiratory status: spontaneous breathing, nonlabored ventilation and respiratory function stable Cardiovascular status: blood pressure returned to baseline and stable Postop Assessment: adequate PO intake Anesthetic complications: no   There were no known notable events for this encounter.   Last Vitals:  Vitals:   06/12/22 1112 06/12/22 1126  BP:  103/62  Pulse: 67 73  Resp: 12 15  Temp: 36.7 C (!) 36.2 C  SpO2: 97% 96%    Last Pain:  Vitals:   06/12/22 1126  TempSrc: Temporal  PainSc: 2                  Reed Breech

## 2022-06-12 NOTE — Evaluation (Signed)
Physical Therapy Evaluation Patient Details Name: Lynn Finley MRN: [id number] DOB: 16-Oct-1955 Today's Date: 06/12/2022  History of Present Illness  Patient is a 67 year old female with Degenerative joint disease, right hip. S/p right total hip arthroplasty. Recent left total hip arthroplasty  Clinical Impression  Patient is agreeable to PT. She reports using her cane or rolling walker with ambulation since recent hip surgery. She lives at home with her spouse with 5 steps to enter and has DME in place.  Today, she is eager to get out of bed. Good safety awareness with all mobility. Gait training initiated with rolling walker with improving gait pattern with increased ambulation distance. Patient performed stair negotiation with supervision without difficulty with initial instruction on sequencing. The patient reports 5/10 pain in the right hip after mobility. Current mobility level is adequate for discharge home with family support. PT will follow again tomorrow if patient does not discharge today as anticipated. Home health PT has already been arranged per patient report.      Recommendations for follow up therapy are one component of a multi-disciplinary discharge planning process, led by the attending physician.  Recommendations may be updated based on patient status, additional functional criteria and insurance authorization.  Follow Up Recommendations       Assistance Recommended at Discharge PRN  Patient can return home with the following  Assist for transportation;Help with stairs or ramp for entrance    Equipment Recommendations None recommended by PT  Recommendations for Other Services       Functional Status Assessment Patient has had a recent decline in their functional status and demonstrates the ability to make significant improvements in function in a reasonable and predictable amount of time.     Precautions / Restrictions Precautions Precautions: Fall;Posterior  Hip Precaution Booklet Issued: Yes (comment) Restrictions Weight Bearing Restrictions: Yes RLE Weight Bearing: Weight bearing as tolerated      Mobility  Bed Mobility Overal bed mobility: Needs Assistance Bed Mobility: Supine to Sit     Supine to sit: Min guard, HOB elevated          Transfers Overall transfer level: Needs assistance Equipment used: Rolling walker (2 wheels) Transfers: Sit to/from Stand Sit to Stand: Supervision           General transfer comment: good safety awareness with transfers. education on RLE positioning with sitting for comfort and to maintain hip precuations with carry over demonstrated    Ambulation/Gait Ambulation/Gait assistance: Min guard, Supervision Gait Distance (Feet): 150 Feet Assistive device: Rolling walker (2 wheels) Gait Pattern/deviations: Step-to pattern, Step-through pattern, Decreased stance time - right Gait velocity: decreased     General Gait Details: step to progressing to step through with increased ambulation distance. Min guard initially progressing to supervision. reinforced foot placement with turns to avoid internal rotation and maintain hip precuations.  Stairs Stairs: Yes Stairs assistance: Supervision Stair Management: Two rails, Step to pattern, Forwards Number of Stairs: 4 General stair comments: patient went up/down 4 steps with rails (instructed to use her single point cane in the right hand as she does at home going into the home) with supervision. initial cues for sequencing with carry over demonstrated  Wheelchair Mobility    Modified Rankin (Stroke Patients Only)       Balance  Pertinent Vitals/Pain Pain Assessment Pain Assessment: 0-10 Pain Score: 5  Pain Location: R hip Pain Descriptors / Indicators: Discomfort Pain Intervention(s): Limited activity within patient's tolerance, Monitored during session, Repositioned, Ice  applied    Home Living Family/patient expects to be discharged to:: Private residence Living Arrangements: Spouse/significant other Available Help at Discharge: Family Type of Home: House Home Access: Stairs to enter Entrance Stairs-Rails: Left Entrance Stairs-Number of Steps: 5 with one rail on left going up   Home Layout: Two level;Able to live on main level with bedroom/bathroom Home Equipment: Rolling Walker (2 wheels);Cane - single point;BSC/3in1      Prior Function Prior Level of Function : Independent/Modified Independent             Mobility Comments: using rolling walker or cane for ambulation       Hand Dominance        Extremity/Trunk Assessment   Upper Extremity Assessment Upper Extremity Assessment: Overall WFL for tasks assessed    Lower Extremity Assessment Lower Extremity Assessment: RLE deficits/detail RLE Deficits / Details: mild edema noted in the foot/ankle. patient is able to activate hip/knee/ankle movement in gravity eliminated position. no knee buckling with weight bearing RLE Sensation: WNL       Communication   Communication: No difficulties  Cognition Arousal/Alertness: Awake/alert Behavior During Therapy: WFL for tasks assessed/performed Overall Cognitive Status: Within Functional Limits for tasks assessed                                          General Comments      Exercises     Assessment/Plan    PT Assessment Patient needs continued PT services  PT Problem List Decreased strength;Decreased range of motion;Decreased activity tolerance;Decreased balance;Decreased mobility;Decreased safety awareness       PT Treatment Interventions DME instruction;Gait training;Stair training;Functional mobility training;Therapeutic activities;Therapeutic exercise;Balance training;Neuromuscular re-education;Cognitive remediation;Patient/family education    PT Goals (Current goals can be found in the Care Plan section)   Acute Rehab PT Goals Patient Stated Goal: to be able to get back to the cane or no device at all with walking PT Goal Formulation: With patient Time For Goal Achievement: 06/26/22 Potential to Achieve Goals: Good    Frequency BID     Co-evaluation               AM-PAC PT "6 Clicks" Mobility  Outcome Measure Help needed turning from your back to your side while in a flat bed without using bedrails?: A Little Help needed moving from lying on your back to sitting on the side of a flat bed without using bedrails?: A Little Help needed moving to and from a bed to a chair (including a wheelchair)?: A Little Help needed standing up from a chair using your arms (e.g., wheelchair or bedside chair)?: A Little Help needed to walk in hospital room?: A Little Help needed climbing 3-5 steps with a railing? : A Little 6 Click Score: 18    End of Session Equipment Utilized During Treatment: Gait belt Activity Tolerance: Patient tolerated treatment well Patient left: in chair;with call bell/phone within reach;with nursing/sitter in room Nurse Communication: Mobility status PT Visit Diagnosis: Other abnormalities of gait and mobility (R26.89);Difficulty in walking, not elsewhere classified (R26.2)    Time: 2956-2130 PT Time Calculation (min) (ACUTE ONLY): 27 min   Charges:   PT Evaluation $PT Eval Low Complexity: 1 Low  PT Treatments $Gait Training: 8-22 mins        Donna Bernard, PT, MPT   Ina Homes 06/12/2022, 2:36 PM

## 2022-06-12 NOTE — Op Note (Signed)
06/12/2022  9:46 AM  Patient:   Lynn Finley  Pre-Op Diagnosis:   Degenerative joint disease, right hip.  Post-Op Diagnosis:   Same.  Procedure:   Right total hip arthroplasty.  Surgeon:   Maryagnes Amos, MD  Assistant:   Horris Latino, PA-C  Anesthesia:   GET  Findings:   As above.  Complications:   None  EBL:   150 cc  Fluids:   600 cc crystalloid  UOP:   None cc  TT:   None  Drains:   None  Closure:   Staples  Implants:   Biomet press-fit system with a #10 offset Echo femoral stem, a 50 mm acetabular shell with an E-poly hi-wall liner, and a 36 mm ceramic head with a +0 mm neck.  Brief Clinical Note:   The patient is a 67 year old female with a history of gradually worsening right hip pain.  Her symptoms have progressed despite medications, activity modification, etc.  Her history and examination are consistent with degenerative joint disease confirmed by plain radiographs.  The patient is now 2+ months status post a left total hip arthroplasty from which she has done quite well.  She presents at this time for a right total hip arthroplasty.   Procedure:   The patient was brought into the operating room. After adequate general endotracheal intubation and anesthesia was obtained, the patient was repositioned in the left lateral decubitus position and secured using a lateral hip positioner. The right hip and lower extremity were prepped with ChloroPrep solution before being draped sterilely. Preoperative antibiotics were administered. A timeout was performed to verify the appropriate surgical site.    A standard posterior approach to the hip was made through an approximately 6-7 inch incision. The incision was carried down through the subcutaneous tissues to expose the gluteal fascia and proximal end of the iliotibial band. These structures were split the length of the incision and the Charnley self-retaining hip retractor placed. The bursal tissues were swept  posteriorly to expose the short external rotators. The anterior border of the piriformis tendon was identified and this plane developed down through the capsule to enter the joint. A flap of tissue was elevated off the posterior aspect of the femoral neck and greater trochanter and retracted posteriorly. This flap included the piriformis tendon, the short external rotators, and the posterior capsule. The soft tissues were elevated off the lateral aspect of the ilium and a large Steinmann pin placed bicortically.   With the right leg aligned over the left, a drill bit was placed into the greater trochanter parallel to the Steinmann pin and the distance between these two pins measured in order to optimize leg lengths postoperatively. The drill bit was removed and the hip dislocated. The piriformis fossa was debrided of soft tissues before the intramedullary canal was accessed through this point using a triple step reamer. The canal was reamed sequentially beginning with a #7 tapered reamer and progressing to a #10 tapered reamer. This provided excellent circumferential chatter. Using the appropriate guide, a femoral neck cut was made 10-12 mm above the lesser trochanter. The femoral head was removed.  Attention was directed to the acetabular side. The labrum was debrided circumferentially before the ligamentum teres was removed using a large curette. A line was drawn on the drapes corresponding to the native version of the acetabulum. This line was used as a guide while the acetabulum was reamed sequentially beginning with a 43 mm reamer and progressing to a 49 mm  reamer. This provided excellent circumferential chatter. The 49 mm trial acetabulum was positioned and found to fit quite well. Therefore, the 50 mm acetabular shell was selected and impacted into place with care taken to maintain the appropriate version. The trial high wall liner was inserted.  Attention was redirected to the femoral side. A box  osteotome was used to establish version before the canal was broached sequentially beginning with a #7 broach and progressing to a #10 broach. This was left in place and several trial reductions performed using both the standard and laterally offset neck options, as well as the -6 mm and +0 mm neck lengths. After removing the trial components, the "manhole cover" was placed into the apex of the acetabular shell and tightened securely. The permanent E-polyethylene hi-wall liner was impacted into the acetabular shell and its locking mechanism verified using a quarter-inch osteotome. Next, the #10 laterally offset femoral stem was impacted into place with care taken to maintain the appropriate version. A repeat trial reduction was performed using the +0 mm neck length. The +0 mm neck length demonstrated excellent stability both in extension and external rotation as well as with flexion to 90 and internal rotation beyond 70. It also was stable in the position of sleep. In addition, leg lengths appeared to be restored appropriately, both by reassessing the position of the right leg over the left, as well as by measuring the distance between the Steinmann pin and the drill bit. The 36 mm ceramic head with the +0 mm neck was impacted onto the stem of the femoral component. The Morse taper locking mechanism was verified using manual distraction before the head was relocated and placed through a range of motion with the findings as described above.  The wound was copiously irrigated with sterile saline solution via the jet lavage system before the peri-incisional and pericapsular tissues were injected with a "cocktail" of 20 cc of Exparel, 30 cc of 0.5% Sensorcaine, 2 cc of Kenalog 40 (80 mg), and 30 mg of Toradol diluted out to 90 cc with normal saline to help with postoperative analgesia. The posterior flap was reapproximated to the posterior aspect of the greater trochanter using #2 Tycron interrupted sutures placed  through drill holes. Several additional #2 Tycron interrupted sutures were used to reinforce this layer of closure. The iliotibial band was reapproximated using #1 Vicryl interrupted sutures before the gluteal fascia was closed using a running #1 Vicryl suture. At this point, 1 g of transexemic acid in 10 cc of normal saline was injected into the joint to help reduce postoperative bleeding. The subcutaneous tissues were closed in several layers using 2-0 Vicryl interrupted sutures before the skin was closed using staples. A sterile occlusive dressing was applied to the wound. The patient was then rolled back into the supine position on his/her hospital bed before being awakened, extubated, and returned to the recovery room in satisfactory condition after tolerating the procedure well.

## 2022-06-12 NOTE — Anesthesia Procedure Notes (Signed)
Procedure Name: Intubation Date/Time: 06/12/2022 7:34 AM  Performed by: Morene Crocker, CRNAPre-anesthesia Checklist: Patient identified, Patient being monitored, Timeout performed, Emergency Drugs available and Suction available Patient Re-evaluated:Patient Re-evaluated prior to induction Oxygen Delivery Method: Circle system utilized Preoxygenation: Pre-oxygenation with 100% oxygen Induction Type: IV induction Ventilation: Mask ventilation without difficulty Laryngoscope Size: 3 and McGraph Grade View: Grade I Tube type: Oral Tube size: 7.0 mm Number of attempts: 1 Airway Equipment and Method: Stylet Placement Confirmation: ETT inserted through vocal cords under direct vision, positive ETCO2 and breath sounds checked- equal and bilateral Secured at: 22 cm Tube secured with: Tape Dental Injury: Teeth and Oropharynx as per pre-operative assessment  Comments: Smooth, atraumatic intubation. No complications noted.

## 2022-06-12 NOTE — H&P (Signed)
History of Present Illness:  Lynn Finley is a 67 y.o. female who presents for follow-up of her progressively worsening right hip pain. She has known degenerative joint disease of the right hip. She denies any specific injury to the hip, but wonders if her symptoms may have been aggravated by her trying to offload the left leg during the healing process following her recent left THA. She has increased pain with any prolonged standing or ambulation, as well as when trying to ascend/descend stairs. She is using a cane for balance and support, and is not able to reciprocate stairs. She denies any numbness or paresthesias down her leg to her foot.  Current Outpatient Medications:  acetaminophen (TYLENOL) 500 MG tablet Take 1,000 mg by mouth every 6 (six) hours as needed for Pain  bisoproloL-hydroCHLOROthiazide (ZIAC) 5-6.25 mg tablet TAKE 1 TABLET BY MOUTH ONCE DAILY 100 tablet 2  cholecalciferol (VITAMIN D3) 1000 unit tablet Take 1,000 Units by mouth  diphenhydrAMINE (BENADRYL) 50 MG capsule Take 1 capsule by mouth at bedtime  docusate (COLACE) 50 MG capsule Take 50 mg by mouth 2 (two) times daily  estradioL (ESTRACE) 0.01 % (0.1 mg/gram) vaginal cream Place 1 g vaginally twice a week 45 g 3  gabapentin (NEURONTIN) 300 MG capsule Take 2 capsules (600 mg total) by mouth at bedtime (Patient taking differently: Take 300 mg by mouth at bedtime) 180 capsule 3  oxyCODONE (ROXICODONE) 5 MG immediate release tablet Take 5 mg by mouth every 4 (four) hours as needed for Pain  polyethylene glycol (MIRALAX) packet Take by mouth  psyllium, sugar, (METAMUCIL) 3.4 gram packet Take 1 packet by mouth once daily.  tiZANidine (ZANAFLEX) 2 MG capsule Take by mouth  traMADoL (ULTRAM) 50 mg tablet Take 1 tablet (50 mg total) by mouth every 8 (eight) hours as needed 21 tablet 0  cyanocobalamin (VITAMIN B12) 1000 MCG tablet Take 1,000 mcg by mouth once daily (Patient not taking: Reported on 04/17/2022)   Allergies: No  Known Allergies  Past Medical History:  Chicken pox  GERD (gastroesophageal reflux disease)  Hyperlipidemia  Hypertension   Past Surgical History:  lumpectomy of breast 1995  HYSTERECTOMY 1996  neck surgery 2012  COLONOSCOPY N/A 05/31/2011 (Dr. Elvera Lennox. Henihan @ [facility] - Tubular Adenoma)  EGD N/A 07/09/2012 (Dr. Elvera Lennox. Henihan @ [facility] - Nml)  ROTATOR CUFF SURGERY Right 2015  COLONOSCOPY 04/25/2020 (Diverticulosis/PHx CP/Repeat 70yrs/CTL)  Left total hip arthroplasty. Left 04/03/2022 (Dr.Xavier Munger)  CESAREAN SECTION 1977, 1979, 1981  CHOLECYSTECTOMY   Family History:  Heart disease Mother  Arthritis Mother  Diabetes type I Brother  Seizures Brother   Social History:   Socioeconomic History:  Marital status: Married  Spouse name: Richard  Number of children: 3  Years of education: 13  Tobacco Use  Smoking status: Never  Smokeless tobacco: Never  Vaping Use  Vaping status: Never Used  Substance and Sexual Activity  Alcohol use: Yes  Comment: occas  Drug use: No  Sexual activity: Yes  Partners: Male  Birth control/protection: Surgical  Comment: hysterectomy   Social Determinants of Health:   Food Insecurity: No Food Insecurity (04/03/2022)  Received from [facility], [place]  Hunger Vital Sign  Worried About Running Out of Food in the Last Year: Never true  Ran Out of Food in the Last Year: Never true  Transportation Needs: No Transportation Needs (04/03/2022)  Received from [facility] [place]  [facility] - Transportation  Lack of Transportation (Medical): No  Lack of Transportation (Non-Medical): No   Review of Systems:  A comprehensive 14 point ROS was performed, reviewed, and the pertinent orthopaedic findings are documented in the HPI.  Physical Exam: Vitals:  05/14/22 1053  BP: 118/74  Weight: 81.6 kg (179 lb 12.8 oz)  Height: 167.6 cm (5\' 6" )  PainSc: 0-No pain  PainLoc: Hip   General/Constitutional: The  patient appears to be well-nourished, well-developed, and in no acute distress. Neuro/Psych: Normal mood and affect, oriented to person, place and time. Eyes: Non-icteric. Pupils are equal, round, and reactive to light, and exhibit synchronous movement. ENT: Unremarkable. Lymphatic: No palpable adenopathy. Respiratory: Lungs clear to auscultation, Normal chest excursion, No wheezes, and Non-labored breathing Cardiovascular: Regular rate and rhythm. No murmurs. and No edema, swelling or tenderness, except as noted in detailed exam. Integumentary: No impressive skin lesions present, except as noted in detailed exam. Musculoskeletal: Unremarkable, except as noted in detailed exam.  Right hip exam: Skin inspection around the right hip is unremarkable. No swelling, erythema, ecchymosis, abrasions, or other skin abnormalities are identified. She denies any tenderness to palpation along the anterior, lateral, or posterior aspects of the hip. She is able to arise from a seated position without difficulty. In stance, her pelvis is level. She can heel raise and toe raise without difficulty and is able to march in place without evidence of hip abductor weakness. She exhibits flexion to 100 degrees, internal rotation to 10 degrees, and external rotation to 40 degrees. She notes mild pain with internal and external rotation. She is grossly neurovascularly intact to the right lower extremity and foot, although she does note mild swelling in the right foot and ankle on today's visit.  X-rays/MRI/Lab data:  X-rays of the pelvis and left hip are obtained. These films demonstrate excellent position of the femoral and acetabular components which are without evidence of loosening. The femoral head is concentrically located within the acetabulum. Incidental note is made of moderate to severe degenerative changes of the right hip as manifest by joint space narrowing symmetrically with early osteophyte formation. The left  leg appears to be about 3 to 5 mm longer than the right radiographically. No new acute bony abnormalities are identified.  Assessment: 1. Primary osteoarthritis of right hip.  2. Status post total hip replacement, left.   Plan: The treatment options were discussed with the patient and her husband. In addition, patient educational materials were provided regarding the diagnosis and treatment options. Regarding her right hip, the patient is quite frustrated by her worsening symptoms and functional limitations, and is ready to consider more aggressive treatment options. Therefore, I have recommended a surgical procedure, specifically a right total hip arthroplasty. The procedure was discussed with the patient, as were the potential risks (including bleeding, infection, nerve and/or blood vessel injury, persistent or recurrent pain, loosening and/or failure of the components, dislocation, leg length inequality, need for further surgery, blood clots, strokes, heart attacks and/or arhythmias, pneumonia, etc.) and benefits. The patient states her understanding and wishes to proceed. All of the patient's questions and concerns were answered. She can call any time with further concerns. She will follow up post-surgery, routine.    H&P reviewed and patient re-examined. No changes.

## 2022-06-12 NOTE — TOC Progression Note (Signed)
Transition of Care [facility]) - Progression Note    Patient Details  Name: Lynn Finley MRN: [id number] Date of Birth: 1955/06/30  Transition of Care [facility]) CM/SW Contact  Marlowe Sax, RN Phone Number: 06/12/2022, 11:59 AM  Clinical Narrative:   [facility] set up by surgeons office for [facility] prior to surgery         Expected Discharge Plan and Services         Expected Discharge Date: 06/12/22                                     Social Determinants of Health (SDOH) Interventions SDOH Screenings   Food Insecurity: No Food Insecurity (04/03/2022)  Housing: Low Risk  (04/03/2022)  Transportation Needs: No Transportation Needs (04/03/2022)  Utilities: Not At Risk (04/03/2022)  Tobacco Use: Low Risk  (06/12/2022)    Readmission Risk Interventions     No data to display

## 2022-06-12 NOTE — Transfer of Care (Signed)
Immediate Anesthesia Transfer of Care Note  Patient: Lynn Finley  Procedure(s) Performed: TOTAL HIP ARTHROPLASTY (Right: Hip)  Patient Location: PACU  Anesthesia Type:General  Level of Consciousness: drowsy  Airway & Oxygen Therapy: Patient Spontanous Breathing and Patient connected to face mask  Post-op Assessment: Report given to RN and Post -op Vital signs reviewed and stable  Post vital signs: Reviewed and stable  Last Vitals:  Vitals Value Taken Time  BP 122/56 06/12/22 1000  Temp    Pulse 68 06/12/22 1002  Resp 12 06/12/22 1002  SpO2 100 % 06/12/22 1002  Vitals shown include unvalidated device data.  Last Pain:  Vitals:   06/12/22 0626  TempSrc: Temporal  PainSc: 2          Complications: There were no known notable events for this encounter.

## 2022-06-12 NOTE — Anesthesia Preprocedure Evaluation (Signed)
Anesthesia Evaluation  Patient identified by MRN, date of birth, ID band Patient awake    Reviewed: Allergy & Precautions, NPO status , Patient's Chart, lab work & pertinent test results  History of Anesthesia Complications Negative for: history of anesthetic complications  Airway Mallampati: II  TM Distance: >3 FB Neck ROM: Full    Dental no notable dental hx. (+) Teeth Intact, Dental Advidsory Given   Pulmonary neg pulmonary ROS, neg sleep apnea, neg COPD, Patient abstained from smoking.Not current smoker   Pulmonary exam normal breath sounds clear to auscultation       Cardiovascular Exercise Tolerance: Good METShypertension, Pt. on medications (-) CAD and (-) Past MI (-) dysrhythmias  Rhythm:Regular Rate:Normal - Systolic murmurs    Neuro/Psych negative neurological ROS  negative psych ROS   GI/Hepatic ,GERD  Controlled,,(+)     (-) substance abuse    Endo/Other  neg diabetes    Renal/GU negative Renal ROS     Musculoskeletal  (+) Arthritis ,    Abdominal   Peds  Hematology   Anesthesia Other Findings Past Medical History: No date: Chicken pox 12/2021: COVID No date: DDD (degenerative disc disease), cervical 01/2022: EBV infection No date: GERD (gastroesophageal reflux disease) No date: Hypercholesterolemia No date: Hypertension  Reproductive/Obstetrics                             Anesthesia Physical Anesthesia Plan  ASA: 2  Anesthesia Plan: General ETT   Post-op Pain Management: Ofirmev IV (intra-op)*   Induction: Intravenous  PONV Risk Score and Plan: 4 or greater and Ondansetron, Dexamethasone and Midazolam  Airway Management Planned: Oral ETT  Additional Equipment: None  Intra-op Plan:   Post-operative Plan: Extubation in OR  Informed Consent: I have reviewed the patients History and Physical, chart, labs and discussed the procedure including the risks,  benefits and alternatives for the proposed anesthesia with the patient or authorized representative who has indicated his/her understanding and acceptance.     Dental Advisory Given  Plan Discussed with: Anesthesiologist, CRNA and Surgeon  Anesthesia Plan Comments: (Patient consented for risks of anesthesia including but not limited to:  - adverse reactions to medications - damage to eyes, teeth, lips or other oral mucosa - nerve damage due to positioning  - sore throat or hoarseness - Damage to heart, brain, nerves, lungs, other parts of body or loss of life  Patient voiced understanding.)        Anesthesia Quick Evaluation

## 2022-06-13 ENCOUNTER — Encounter: Payer: Self-pay | Admitting: Surgery

## 2022-06-13 ENCOUNTER — Other Ambulatory Visit: Payer: Self-pay

## 2022-06-15 LAB — SURGICAL PATHOLOGY

## 2022-12-19 ENCOUNTER — Other Ambulatory Visit: Payer: Self-pay | Admitting: Internal Medicine

## 2022-12-19 DIAGNOSIS — Z1231 Encounter for screening mammogram for malignant neoplasm of breast: Secondary | ICD-10-CM

## 2023-03-16 IMAGING — MR MR KNEE*L* W/O CM
6 series · 40 of 40 positions shown · non-contrast
Comparison: None.

CLINICAL DATA: Chronic left knee pain.  Pain and of the patella.

EXAM:
MRI OF THE LEFT KNEE WITHOUT CONTRAST
TECHNIQUE: Multiplanar, multisequence MR imaging of the knee was performed. No
intravenous contrast was administered.

[Series 8: T2 fat-sat · axial · left · 4.0mm · 0.50mm/px · z∈[-59,+64]mm · 5 of 26 slices shown (1 of 3)]
[im 1/26]
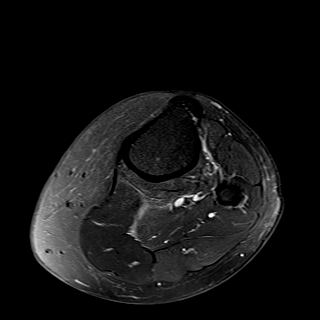
[im 7/26]
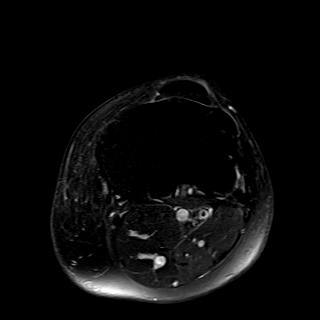
[im 13/26]
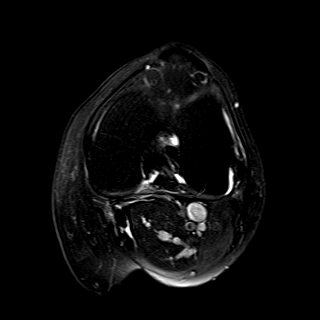
[im 19/26]
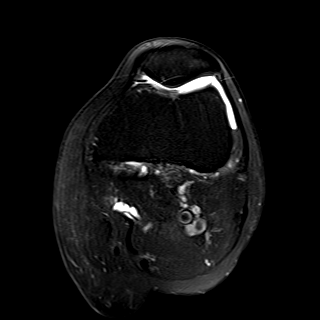
[im 26/26]
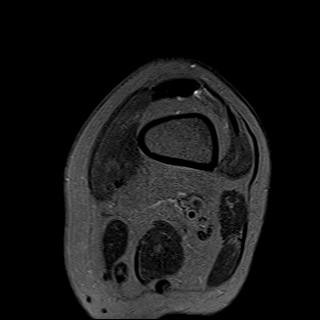

[Series 9: T1 · coronal · left · 4.0mm · 0.47mm/px · 7 of 32 slices shown]
[im 1/32]
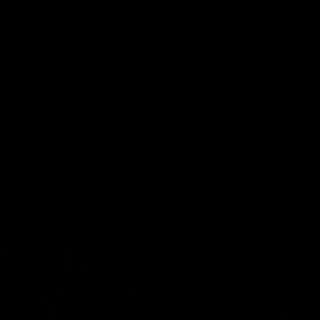
[im 6/32]
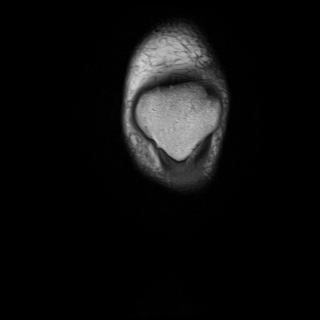
[im 11/32]
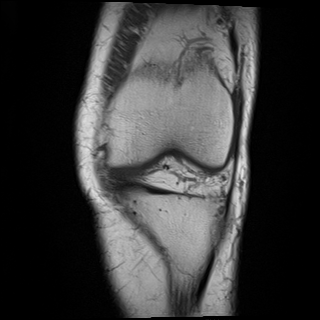
[im 16/32]
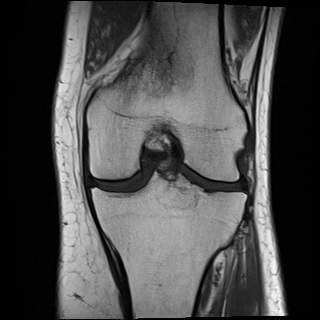
[im 21/32]
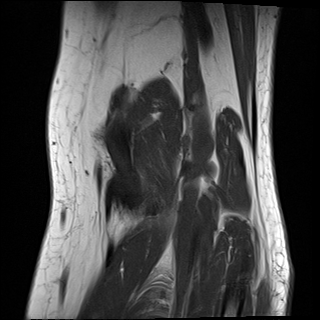
[im 26/32]
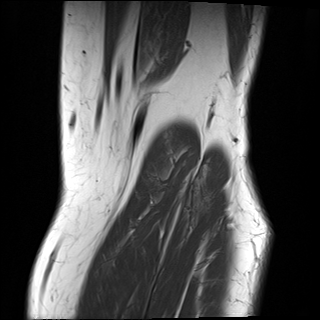
[im 32/32]
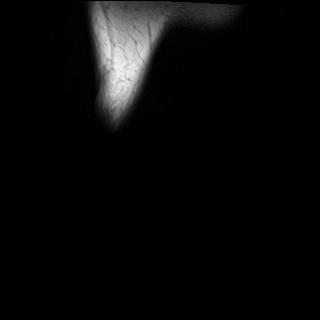

[Series 10: T2 fat-sat · coronal · left · 4.0mm · 0.47mm/px · 7 of 32 slices shown (2 of 3)]
[im 1/32]
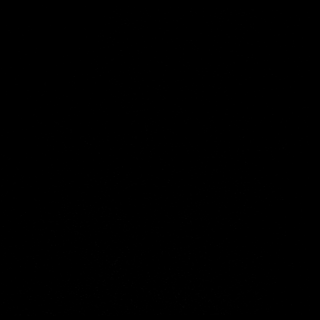
[im 6/32]
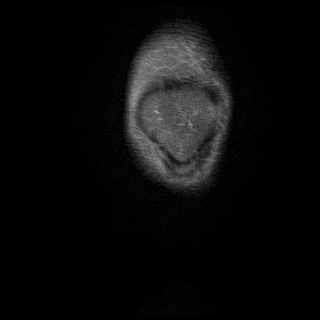
[im 11/32]
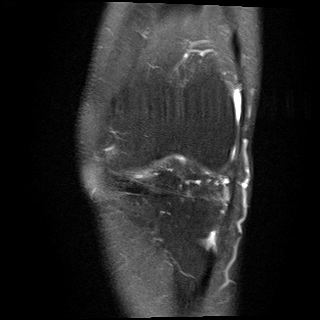
[im 16/32]
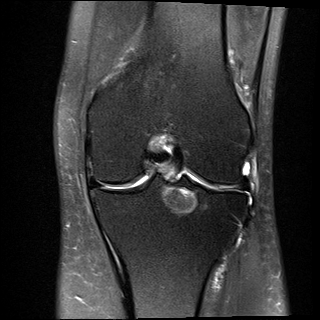
[im 21/32]
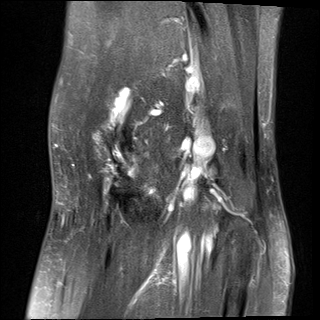
[im 26/32]
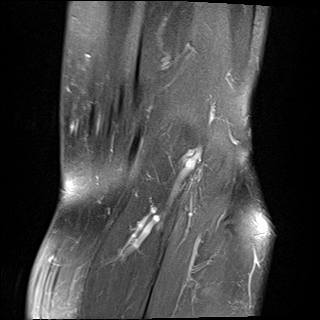
[im 32/32]
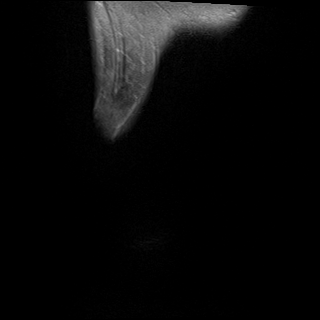

[Series 11: PD fat-sat · coronal · left · 4.0mm · 0.59mm/px · 6 of 30 slices shown (1 of 2)]
[im 1/30]
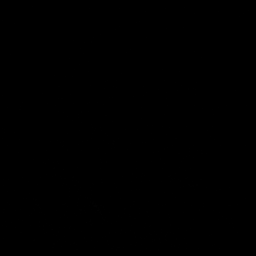
[im 6/30]
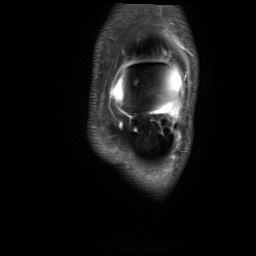
[im 12/30]
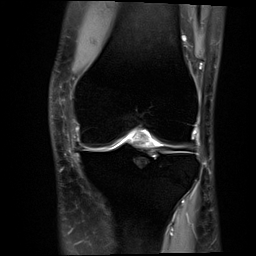
[im 18/30]
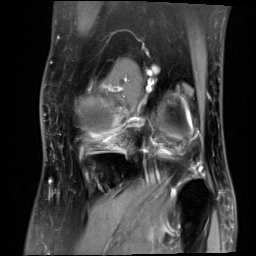
[im 24/30]
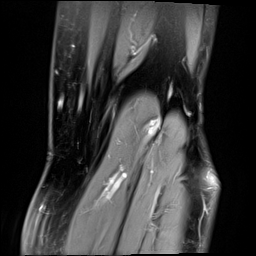
[im 30/30]
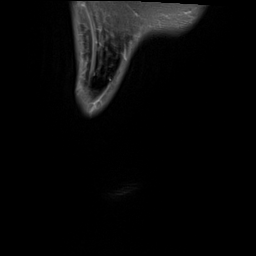

[Series 12: PD fat-sat · sagittal · left · 3.0mm · 0.47mm/px · 7 of 35 slices shown (2 of 2)]
[im 1/35]
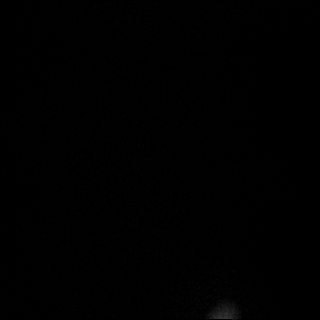
[im 6/35]
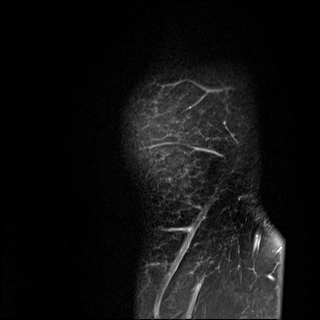
[im 12/35]
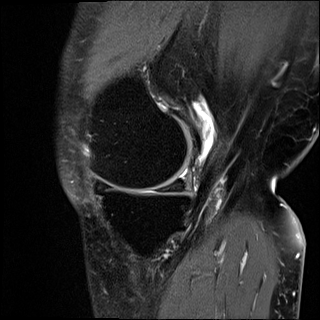
[im 18/35]
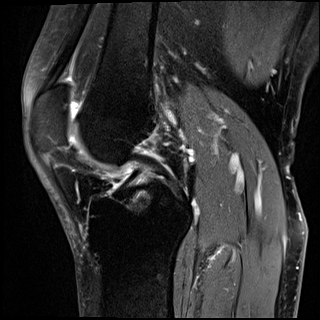
[im 23/35]
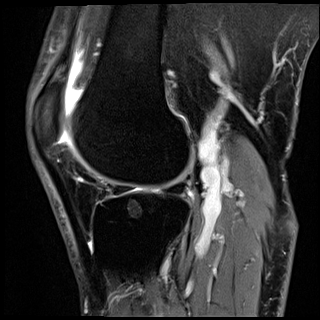
[im 29/35]
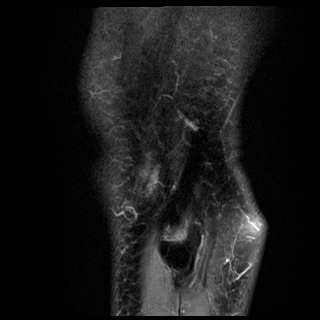
[im 35/35]
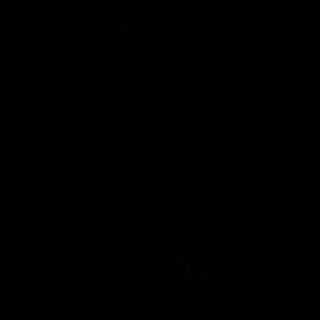

[Series 13: T2 fat-sat · sagittal · left · 3.0mm · 0.47mm/px · 8 of 36 slices shown (3 of 3)]
[im 1/36]
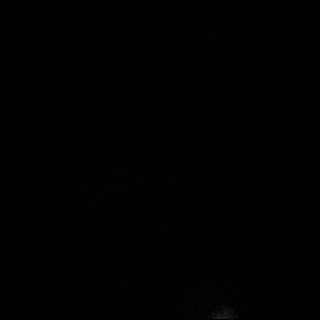
[im 6/36]
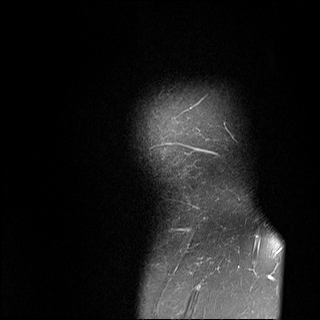
[im 11/36]
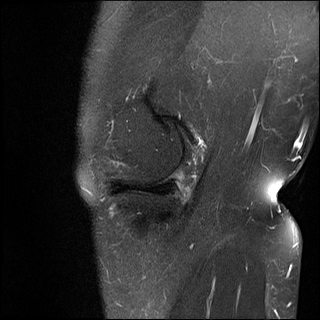
[im 16/36]
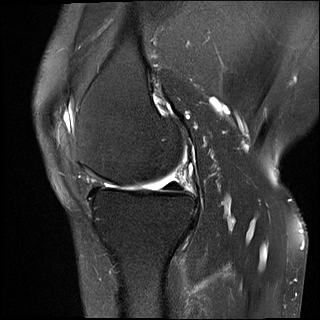
[im 21/36]
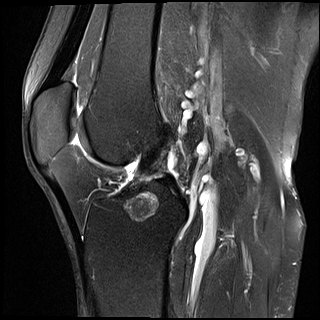
[im 26/36]
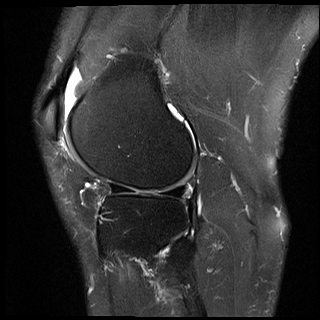
[im 31/36]
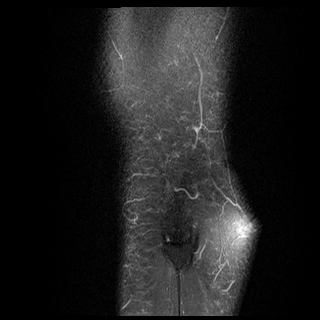
[im 36/36]
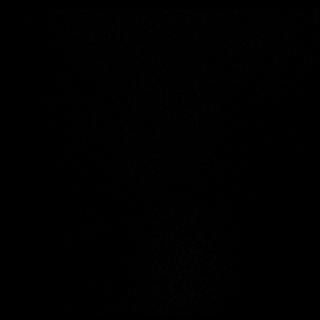

[40 of 40 positions shown; findings below may reference images not displayed]

FINDINGS: MENISCI

Medial: Degeneration of the medial meniscus with peripheral meniscal
extrusion as can be seen with loss of hoop strength. No discrete
tear.

Lateral: Tiny radial tear of the free edge of the body of the
lateral meniscus.

LIGAMENTS

Cruciates: ACL and PCL are intact.

Collaterals: Medial collateral ligament is intact. Lateral
collateral ligament complex is intact.

CARTILAGE

Patellofemoral: Partial-thickness cartilage loss of the medial
patellar facet.

Medial: Partial-thickness cartilage loss of the medial femorotibial
compartment.

Lateral: Mild partial-thickness cartilage loss of the lateral
femorotibial compartment.

JOINT: No joint effusion. Normal Gino Heart. No plical
thickening.

POPLITEAL FOSSA: Popliteus tendon is intact. Tiny Baker's cyst.

EXTENSOR MECHANISM: Intact quadriceps tendon. Intact patellar
tendon. Intact lateral patellar retinaculum. Intact medial patellar
retinaculum. Intact MPFL.

BONES: No aggressive osseous lesion. No fracture or dislocation.
Intraosseous lipoma in the proximal tibial epiphysis.

Other: No fluid collection or hematoma. Muscles are normal.
IMPRESSION: 1. Tiny radial tear of the free edge of the body of the lateral
meniscus.
2. Degeneration of the medial meniscus with peripheral meniscal
extrusion as can be seen with loss of hoop strength. No discrete
tear.
3. Tricompartmental cartilage abnormalities as described above.

## 2023-03-21 ENCOUNTER — Ambulatory Visit
Admission: RE | Admit: 2023-03-21 | Discharge: 2023-03-21 | Disposition: A | Discharge status: Home / Self Care | Payer: Medicare Other | Source: Ambulatory Visit | Attending: Internal Medicine | Admitting: Internal Medicine

## 2023-03-21 DIAGNOSIS — Z1231 Encounter for screening mammogram for malignant neoplasm of breast: Secondary | ICD-10-CM | POA: Diagnosis present

## 2023-05-14 ENCOUNTER — Ambulatory Visit: Payer: Medicare Other | Admitting: Dermatology

## 2023-05-14 ENCOUNTER — Encounter: Payer: Self-pay | Admitting: Dermatology

## 2023-05-14 DIAGNOSIS — Z808 Family history of malignant neoplasm of other organs or systems: Secondary | ICD-10-CM

## 2023-05-14 DIAGNOSIS — L578 Other skin changes due to chronic exposure to nonionizing radiation: Secondary | ICD-10-CM | POA: Diagnosis not present

## 2023-05-14 DIAGNOSIS — Z1283 Encounter for screening for malignant neoplasm of skin: Secondary | ICD-10-CM | POA: Diagnosis not present

## 2023-05-14 DIAGNOSIS — L821 Other seborrheic keratosis: Secondary | ICD-10-CM

## 2023-05-14 DIAGNOSIS — D692 Other nonthrombocytopenic purpura: Secondary | ICD-10-CM

## 2023-05-14 DIAGNOSIS — W908XXA Exposure to other nonionizing radiation, initial encounter: Secondary | ICD-10-CM

## 2023-05-14 DIAGNOSIS — L820 Inflamed seborrheic keratosis: Secondary | ICD-10-CM

## 2023-05-14 DIAGNOSIS — D1801 Hemangioma of skin and subcutaneous tissue: Secondary | ICD-10-CM

## 2023-05-14 DIAGNOSIS — D2239 Melanocytic nevi of other parts of face: Secondary | ICD-10-CM

## 2023-05-14 DIAGNOSIS — L814 Other melanin hyperpigmentation: Secondary | ICD-10-CM | POA: Diagnosis not present

## 2023-05-14 DIAGNOSIS — D229 Melanocytic nevi, unspecified: Secondary | ICD-10-CM

## 2023-05-14 DIAGNOSIS — H61001 Unspecified perichondritis of right external ear: Secondary | ICD-10-CM

## 2023-05-14 DIAGNOSIS — L853 Xerosis cutis: Secondary | ICD-10-CM

## 2023-05-14 NOTE — Progress Notes (Signed)
 Follow-Up Visit   Subjective  Lynn Finley is a 68 y.o. female who presents for the following: Skin Cancer Screening and Full Body Skin Exam. No personal Hx of skin cancer or dysplastic nevi.   C/O dry places on face. States skin all over is dry. C/O mole in pubic area. Has been treated with LN2 more than once and has not fallen off.   The patient presents for Total-Body Skin Exam (TBSE) for skin cancer screening and mole check. The patient has spots, moles and lesions to be evaluated, some may be new or changing and the patient may have concern these could be cancer.    The following portions of the chart were reviewed this encounter and updated as appropriate: medications, allergies, medical history  Review of Systems:  No other skin or systemic complaints except as noted in HPI or Assessment and Plan.  Objective  Well appearing patient in no apparent distress; mood and affect are within normal limits.  A full examination was performed including scalp, head, eyes, ears, nose, lips, neck, chest, axillae, abdomen, back, buttocks, bilateral upper extremities, bilateral lower extremities, hands, feet, fingers, toes, fingernails, and toenails. All findings within normal limits unless otherwise noted below.   Relevant physical exam findings are noted in the Assessment and Plan.  Right Vulva x1 Erythematous keratotic or waxy stuck-on plaque.       Assessment & Plan   SKIN CANCER SCREENING PERFORMED TODAY.  ACTINIC DAMAGE - Chronic condition, secondary to cumulative UV/sun exposure - diffuse scaly erythematous macules with underlying dyspigmentation - Recommend daily broad spectrum sunscreen SPF 30+ to sun-exposed areas, reapply every 2 hours as needed.  - Staying in the shade or wearing long sleeves, sun glasses (UVA+UVB protection) and wide brim hats (4-inch brim around the entire circumference of the hat) are also recommended for sun protection.  - Call for new or changing  lesions.  LENTIGINES, SEBORRHEIC KERATOSES, HEMANGIOMAS - Benign normal skin lesions - Benign-appearing - Call for any changes  MELANOCYTIC NEVI - Tan-brown and/or pink-flesh-colored symmetric macules and papules - 4 mm pink-tan papule at right inferior zygoma - 5 x 3 mm flesh-tan papule at right medial cheek - Benign appearing on exam today - Observation - Call clinic for new or changing moles - Recommend daily use of broad spectrum spf 30+ sunscreen to sun-exposed areas.   Xerosis - diffuse xerotic patches - recommend gentle, hydrating skin care - gentle skin care handout given  FAMILY HISTORY OF SKIN CANCER What type(s): Unsure  Who affected: Half brother  Purpura - Chronic; persistent and recurrent.  Treatable, but not curable. - Violaceous macules and patches at arms. - Benign - Related to trauma, age, sun damage and/or use of blood thinners, chronic use of topical and/or oral steroids - Observe - Can use OTC arnica containing moisturizer such as Dermend Bruise Formula if desired - Call for worsening or other concerns  Lentigo Exam: 0.6 cm speckled tan macule at right upper cutaneous lip, photo today,  patient denies changes for years.  Treatment:  Benign-appearing.  Observation.  Call clinic for new or changing lesions.  Recommend daily use of broad spectrum spf 30+ sunscreen to sun-exposed areas.    CHONDRODERMATITIS NODULARIS HELICIS Exam: Small firm white papule c/w prominent cartilage at the right superior ear helix  Chondrodermatitis Nodularis Chronica Helicis (CNCH or CNH) is a common, benign inflammatory condition of the ear cartilage and overlying skin associated with very sensitive tender papule(s).  Trauma or pressure from sleeping  on the ear or from cell phone use and sun damage may be exacerbating factors.  Treatment may include using a C-shaped airplane neck pillow for sleeping on the side of the head so no pressure is on the ear.  Other treatments include  topical or intralesional steroids; liquid nitrogen or laser destruction; shave removal or excision.  The condition can be difficult to treat and persist or recur despite treatment.  Treatment Plan: Avoid pressure to area.   Benign-appearing.  Observation.  Call clinic for new or changing lesions.  Recommend daily use of broad spectrum spf 30+ sunscreen to sun-exposed areas.    INFLAMED SEBORRHEIC KERATOSIS Right Vulva x1 Resistant to LN2 treatment.  Discussed treatment option of shave removal at future appointment. Pt may consider  Return in about 1 year (around 05/13/2024) for TBSE.  I, Jill Parcell, CMA, am acting as scribe for Artemio Larry, MD.   Documentation: I have reviewed the above documentation for accuracy and completeness, and I agree with the above.  Artemio Larry, MD

## 2023-05-14 NOTE — Patient Instructions (Addendum)
 Recommend daily broad spectrum sunscreen SPF 30+ to sun-exposed areas, reapply every 2 hours as needed. Call for new or changing lesions.  Staying in the shade or wearing long sleeves, sun glasses (UVA+UVB protection) and wide brim hats (4-inch brim around the entire circumference of the hat) are also recommended for sun protection.    Chondrodermatitis Nodularis Chronica Helicis (CNCH or CNH) is a common, benign inflammatory condition of the ear cartilage and overlying skin associated with very sensitive tender papule(s).  Trauma or pressure from sleeping on the ear or from cell phone use and sun damage may be exacerbating factors.  Treatment may include using a C-shaped airplane neck pillow for sleeping on the side of the head so no pressure is on the ear.  Other treatments include topical or intralesional steroids; liquid nitrogen or laser destruction; shave removal or excision.  The condition can be difficult to treat and persist or recur despite treatment.      Melanoma ABCDEs  Melanoma is the most dangerous type of skin cancer, and is the leading cause of death from skin disease.  You are more likely to develop melanoma if you: Have light-colored skin, light-colored eyes, or red or blond hair Spend a lot of time in the sun Tan regularly, either outdoors or in a tanning bed Have had blistering sunburns, especially during childhood Have a close family member who has had a melanoma Have atypical moles or large birthmarks  Early detection of melanoma is key since treatment is typically straightforward and cure rates are extremely high if we catch it early.   The first sign of melanoma is often a change in a mole or a new dark spot.  The ABCDE system is a way of remembering the signs of melanoma.  A for asymmetry:  The two halves do not match. B for border:  The edges of the growth are irregular. C for color:  A mixture of colors are present instead of an even brown color. D for diameter:   Melanomas are usually (but not always) greater than 6mm - the size of a pencil eraser. E for evolution:  The spot keeps changing in size, shape, and color.  Please check your skin once per month between visits. You can use a small mirror in front and a large mirror behind you to keep an eye on the back side or your body.   If you see any new or changing lesions before your next follow-up, please call to schedule a visit.  Please continue daily skin protection including broad spectrum sunscreen SPF 30+ to sun-exposed areas, reapplying every 2 hours as needed when you're outdoors.   Staying in the shade or wearing long sleeves, sun glasses (UVA+UVB protection) and wide brim hats (4-inch brim around the entire circumference of the hat) are also recommended for sun protection.      Gentle Skin Care Guide  1. Bathe no more than once a day.  2. Avoid bathing in hot water  3. Use a mild soap like Dove, Vanicream, Cetaphil, CeraVe. Can use Lever 2000 or Cetaphil antibacterial soap  4. Use soap only where you need it. On most days, use it under your arms, between your legs, and on your feet. Let the water rinse other areas unless visibly dirty.  5. When you get out of the bath/shower, use a towel to gently blot your skin dry, don't rub it.  6. While your skin is still a little damp, apply a moisturizing cream such as  Vanicream, CeraVe, Cetaphil, Eucerin, Sarna lotion or plain Vaseline Jelly. For hands apply Neutrogena [place] Hand Cream or Excipial Hand Cream.  7. Reapply moisturizer any time you start to itch or feel dry.  8. Sometimes using free and clear laundry detergents can be helpful. Fabric softener sheets should be avoided. Downy Free & Gentle liquid, or any liquid fabric softener that is free of dyes and perfumes, it acceptable to use  9. If your doctor has given you prescription creams you may apply moisturizers over them        Due to recent changes in healthcare laws, you  may see results of your pathology and/or laboratory studies on MyChart before the doctors have had a chance to review them. We understand that in some cases there may be results that are confusing or concerning to you. Please understand that not all results are received at the same time and often the doctors may need to interpret multiple results in order to provide you with the best plan of care or course of treatment. Therefore, we ask that you please give us  2 business days to thoroughly review all your results before contacting the office for clarification. Should we see a critical lab result, you will be contacted sooner.   If You Need Anything After Your Visit  If you have any questions or concerns for your doctor, please call our main line at [phone] and press option 4 to reach your doctor's medical assistant. If no one answers, please leave a voicemail as directed and we will return your call as soon as possible. Messages left after 4 pm will be answered the following business day.   You may also send us  a message via MyChart. We typically respond to MyChart messages within 1-2 business days.  For prescription refills, please ask your pharmacy to contact our office. Our fax number is [phone].  If you have an urgent issue when the clinic is closed that cannot wait until the next business day, you can page your doctor at the number below.    Please note that while we do our best to be available for urgent issues outside of office hours, we are not available 24/7.   If you have an urgent issue and are unable to reach us , you may choose to seek medical care at your doctor's office, retail clinic, urgent care center, or emergency room.  If you have a medical emergency, please immediately call 911 or go to the emergency department.  Pager Numbers  - Dr. Bary Likes: [phone]  - Dr. Annette Barters: [phone]  - Dr. Felipe Horton: [phone]   In the event of inclement weather, please  call our main line at [phone] for an update on the status of any delays or closures.  Dermatology Medication Tips: Please keep the boxes that topical medications come in in order to help keep track of the instructions about where and how to use these. Pharmacies typically print the medication instructions only on the boxes and not directly on the medication tubes.   If your medication is too expensive, please contact our office at [phone] option 4 or send us  a message through MyChart.   We are unable to tell what your co-pay for medications will be in advance as this is different depending on your insurance coverage. However, we may be able to find a substitute medication at lower cost or fill out paperwork to get insurance to cover a needed medication.   If a prior authorization is required to  get your medication covered by your insurance company, please allow us  1-2 business days to complete this process.  Drug prices often vary depending on where the prescription is filled and some pharmacies may offer cheaper prices.  The website www.goodrx.com contains coupons for medications through different pharmacies. The prices here do not account for what the cost may be with help from insurance (it may be cheaper with your insurance), but the website can give you the price if you did not use any insurance.  - You can print the associated coupon and take it with your prescription to the pharmacy.  - You may also stop by our office during regular business hours and pick up a GoodRx coupon card.  - If you need your prescription sent electronically to a different pharmacy, notify our office through [facility] or by phone at [phone] option 4.     Si Usted Necesita Algo Despus de Su Visita  Tambin puede enviarnos un mensaje a travs de Clinical cytogeneticist. Por lo general respondemos a los mensajes de MyChart en el transcurso de 1 a 2 das hbiles.  Para renovar recetas, por favor pida a  su farmacia que se ponga en contacto con nuestra oficina. Franz Jacks de fax es [place] [phone].  Si tiene un asunto urgente cuando la clnica est cerrada y que no puede esperar hasta el siguiente da hbil, puede llamar/localizar a su doctor(a) al nmero que aparece a continuacin.   Por favor, tenga en cuenta que aunque hacemos todo lo posible para estar disponibles para asuntos urgentes fuera del horario de [place], no estamos disponibles las 24 horas del da, los 7 [address]  [address] de la [place].   Si tiene un problema urgente y no puede comunicarse con nosotros, puede optar por buscar atencin mdica  en el consultorio de su doctor(a), en una clnica privada, en un centro de atencin urgente o en una sala de emergencias.  Si tiene Engineer, drilling, por favor llame inmediatamente al 911 o vaya a la sala de emergencias.  Nmeros de bper  - Dr. Bary Likes: [phone]  - Dra. Annette Barters: [phone]  - Dr. Felipe Horton: [phone]   En caso de inclemencias del tiempo, por favor llame a Lajuan Pila principal al [phone] para una actualizacin sobre el [place] de cualquier retraso o cierre.  Consejos para la medicacin en dermatologa: Por favor, guarde las cajas en las que vienen los medicamentos de uso tpico para ayudarle a seguir las instrucciones sobre dnde y cmo usarlos. Las farmacias generalmente imprimen las instrucciones del medicamento slo en las cajas y no directamente en los tubos del [place].   Si su medicamento es muy caro, por favor, pngase en contacto con Bettyjane Brunet llamando al [phone] y presione la opcin 4 o envenos un mensaje a travs de Clinical cytogeneticist.   No podemos decirle cul ser su copago por los medicamentos por adelantado ya que esto es diferente dependiendo de la cobertura de su seguro. Sin embargo, es posible que podamos encontrar un medicamento sustituto a Audiological scientist un formulario para que el seguro cubra el medicamento que se considera necesario.    Si se requiere una autorizacin previa para que su compaa de seguros [place] su medicamento, por favor permtanos de 1 a 2 das hbiles para completar este proceso.  Los precios de los medicamentos varan con frecuencia dependiendo del Environmental consultant de dnde se surte la receta y alguna farmacias pueden ofrecer precios ms baratos.  El sitio web www.goodrx.com tiene cupones para medicamentos de [facility]  farmacias. Los precios aqu no tienen en cuenta lo que podra costar con la ayuda del seguro (puede ser ms barato con su seguro), pero el sitio web puede darle el precio si no utiliz Tourist information centre manager.  - Puede imprimir el cupn correspondiente y llevarlo con su receta a la farmacia.  - Tambin puede pasar por nuestra oficina durante el horario de atencin regular y Education officer, museum una tarjeta de cupones de GoodRx.  - Si necesita que su receta se enve electrnicamente a una farmacia diferente, informe a nuestra oficina a travs de MyChart de [place] o por telfono llamando al [phone] y presione la opcin 4.

## 2024-01-28 ENCOUNTER — Other Ambulatory Visit: Payer: Self-pay | Admitting: Physician Assistant

## 2024-01-28 ENCOUNTER — Ambulatory Visit
Admission: RE | Admit: 2024-01-28 | Discharge: 2024-01-28 | Disposition: A | Discharge status: Home / Self Care | Source: Ambulatory Visit | Attending: Physician Assistant | Admitting: Physician Assistant

## 2024-01-28 DIAGNOSIS — R1032 Left lower quadrant pain: Secondary | ICD-10-CM | POA: Diagnosis present

## 2024-01-28 MED ORDER — IOHEXOL 300 MG/ML  SOLN
100.0000 mL | Freq: Once | INTRAMUSCULAR | Status: AC | PRN
  Administered 2024-01-28: 100 mL via INTRAVENOUS

## 2024-06-15 ENCOUNTER — Encounter: Admitting: Dermatology
